# Patient Record
Sex: Female | Born: 1940
Health system: Southern US, Community
[De-identification: ages and names within clinical notes are randomized; demographics above are authoritative.]

## PROBLEM LIST (undated history)

## (undated) DIAGNOSIS — E119 Type 2 diabetes mellitus without complications: Secondary | ICD-10-CM

## (undated) DIAGNOSIS — M8580 Other specified disorders of bone density and structure, unspecified site: Secondary | ICD-10-CM

## (undated) DIAGNOSIS — R519 Headache, unspecified: Secondary | ICD-10-CM

## (undated) DIAGNOSIS — T4145XA Adverse effect of unspecified anesthetic, initial encounter: Secondary | ICD-10-CM

## (undated) DIAGNOSIS — F32A Depression, unspecified: Secondary | ICD-10-CM

## (undated) DIAGNOSIS — M797 Fibromyalgia: Secondary | ICD-10-CM

## (undated) DIAGNOSIS — K5730 Diverticulosis of large intestine without perforation or abscess without bleeding: Secondary | ICD-10-CM

## (undated) DIAGNOSIS — Z87442 Personal history of urinary calculi: Secondary | ICD-10-CM

## (undated) DIAGNOSIS — Z87898 Personal history of other specified conditions: Secondary | ICD-10-CM

## (undated) DIAGNOSIS — I10 Essential (primary) hypertension: Secondary | ICD-10-CM

## (undated) DIAGNOSIS — Z973 Presence of spectacles and contact lenses: Secondary | ICD-10-CM

## (undated) DIAGNOSIS — T8859XA Other complications of anesthesia, initial encounter: Secondary | ICD-10-CM

## (undated) DIAGNOSIS — H3530 Unspecified macular degeneration: Secondary | ICD-10-CM

## (undated) DIAGNOSIS — Z860101 Personal history of adenomatous and serrated colon polyps: Secondary | ICD-10-CM

## (undated) DIAGNOSIS — Z86010 Personal history of colonic polyps: Secondary | ICD-10-CM

## (undated) DIAGNOSIS — M1990 Unspecified osteoarthritis, unspecified site: Secondary | ICD-10-CM

## (undated) DIAGNOSIS — E785 Hyperlipidemia, unspecified: Secondary | ICD-10-CM

## (undated) DIAGNOSIS — Z972 Presence of dental prosthetic device (complete) (partial): Secondary | ICD-10-CM

## (undated) DIAGNOSIS — N201 Calculus of ureter: Secondary | ICD-10-CM

## (undated) HISTORY — PX: EXTRACORPOREAL SHOCK WAVE LITHOTRIPSY: SHX1557

## (undated) HISTORY — PX: TONSILLECTOMY: SUR1361

## (undated) HISTORY — PX: LAPAROSCOPIC CHOLECYSTECTOMY: SUR755

## (undated) HISTORY — PX: ANKLE SURGERY: SHX546

## (undated) HISTORY — PX: OTHER SURGICAL HISTORY: SHX169

---

## 1976-11-10 HISTORY — PX: NEPHROLITHOTOMY: SUR881

## 1976-11-10 HISTORY — PX: VAGINAL HYSTERECTOMY: SUR661

## 1990-11-10 HISTORY — PX: CATARACT EXTRACTION W/ INTRAOCULAR LENS  IMPLANT, BILATERAL: SHX1307

## 2004-07-29 ENCOUNTER — Other Ambulatory Visit: Admission: RE | Admit: 2004-07-29 | Discharge: 2004-07-29 | Payer: Self-pay | Admitting: Family Medicine

## 2004-09-20 ENCOUNTER — Ambulatory Visit (HOSPITAL_COMMUNITY): Admission: RE | Admit: 2004-09-20 | Discharge: 2004-09-20 | Payer: Self-pay | Admitting: Gastroenterology

## 2004-09-20 ENCOUNTER — Encounter (INDEPENDENT_AMBULATORY_CARE_PROVIDER_SITE_OTHER): Payer: Self-pay | Admitting: Specialist

## 2005-02-14 ENCOUNTER — Ambulatory Visit (HOSPITAL_COMMUNITY): Admission: RE | Admit: 2005-02-14 | Discharge: 2005-02-14 | Payer: Self-pay | Admitting: Family Medicine

## 2005-03-13 ENCOUNTER — Ambulatory Visit (HOSPITAL_COMMUNITY): Admission: RE | Admit: 2005-03-13 | Discharge: 2005-03-13 | Payer: Self-pay | Admitting: Urology

## 2005-04-04 ENCOUNTER — Ambulatory Visit: Payer: Self-pay

## 2005-04-09 ENCOUNTER — Encounter (INDEPENDENT_AMBULATORY_CARE_PROVIDER_SITE_OTHER): Payer: Self-pay | Admitting: Specialist

## 2005-04-09 ENCOUNTER — Observation Stay (HOSPITAL_COMMUNITY): Admission: RE | Admit: 2005-04-09 | Discharge: 2005-04-10 | Payer: Self-pay | Admitting: General Surgery

## 2007-07-22 ENCOUNTER — Other Ambulatory Visit: Admission: RE | Admit: 2007-07-22 | Discharge: 2007-07-22 | Payer: Self-pay | Admitting: Family Medicine

## 2008-01-17 ENCOUNTER — Encounter
Admission: RE | Admit: 2008-01-17 | Discharge: 2008-01-17 | Payer: Self-pay | Admitting: Physical Medicine and Rehabilitation

## 2008-09-02 ENCOUNTER — Emergency Department (HOSPITAL_COMMUNITY): Admission: EM | Admit: 2008-09-02 | Discharge: 2008-09-02 | Payer: Self-pay | Admitting: Emergency Medicine

## 2008-11-09 ENCOUNTER — Ambulatory Visit (HOSPITAL_BASED_OUTPATIENT_CLINIC_OR_DEPARTMENT_OTHER): Admission: RE | Admit: 2008-11-09 | Discharge: 2008-11-09 | Payer: Self-pay | Admitting: Orthopedic Surgery

## 2008-11-09 HISTORY — PX: OTHER SURGICAL HISTORY: SHX169

## 2009-12-12 ENCOUNTER — Encounter: Admission: RE | Admit: 2009-12-12 | Discharge: 2009-12-12 | Payer: Self-pay | Admitting: Family Medicine

## 2011-03-25 NOTE — Op Note (Signed)
NAME:  Ashley Dean, Ashley Dean NO.:  [id number]   MEDICAL RECORD NO.:  [id number]          PATIENT TYPE:  AMB   LOCATION:  DSC                          FACILITY:  [facility]   PHYSICIAN:  Loreta Ave, M.D. DATE OF BIRTH:  [birth year]   DATE OF PROCEDURE:  11/09/2008  DATE OF DISCHARGE:                               OPERATIVE REPORT   PREOPERATIVE DIAGNOSIS:  Nonunion lateral malleolar fracture, right  ankle distal fibula.   POSTOPERATIVE DIAGNOSIS:  Nonunion lateral malleolar fracture, right  ankle distal fibula.   PROCEDURE:  Takedown and then open reduction and internal fixation of  nonunion lateral malleolus, right ankle.  A locking 6-hole Synthes plate  and 1 interfragmentary screw.   SURGEON:  Loreta Ave, MD   ASSISTANT:  Genene Churn. Barry Dienes, [place], present throughout the entire case  necessary for timely completion of procedure.   ANESTHESIA:  General.   BLOOD LOSS:  Minimal.   TOURNIQUET TIME:  45 minutes.   SPECIMENS:  None.   CULTURES:  None.   COMPLICATIONS:  None.   PROCEDURE:  Soft compressive with short-leg splint.   PROCEDURE:  The patient was brought to the operating room and placed on  the operating table in the supine position.  After adequate anesthesia  had been obtained, tourniquet was applied on the upper aspect of the  right ankle.  Prepped and draped in the usual sterile fashion.  Exsanguinated with elevation of Esmarch.  Tourniquet was inflated to 200  mmHg.  Fluoroscopic guidance was used throughout.  Longitudinal incision  on the posterolateral aspect of the fibula.  Skin and subcutaneous  tissue were divided.  Subperiosteal exposure.  Obvious nonunion with  virtually no healing.  The distal fragment beginning at the level of the  mortise was displaced proximally and posteriorly.  Nonunion was freed  up.  Curetted back to good bleeding bone on either side.  Syndesmosis  intact.  I then reduced the distal fragment into anatomic  alignment and  then put a 6-hole plate posterolaterally on the fibula.  Clamps were  applied to hold the fracture reduced and plate placed.  The plate was  then fixed with locking screws from top to bottom.  At the bottom, care  was taken not to enter the ankle with the screws.  This gave good  alignment and fixation.  The fracture was oblique enough near the distal  end, I put an inner fragmentary screw across the fracture from front to  back with a 4.0-mm cancellous screw to further ensure compression and  fixation.  When that was complete, solid anatomic alignment and fixation  confirmed visually and fluoroscopically.  Good ankle motion and  stability.  Mortise was intact.  Wound was irrigated.  Closed with  Vicryl and staples.  Margin of wound was injected  with Marcaine.  Sterile compressive dressing was applied.  Short-leg  splint was applied.  Tourniquet was deflated and removed.  Anesthesia  was reversed.  Brought to the recovery room.  Tolerated the surgery  well.  No complications.      Margarette Asal.  Eulah Pont, M.D.  Electronically Signed     DFM/MEDQ  D:  11/09/2008  T:  11/10/2008  Job:  [id number]

## 2011-03-28 NOTE — Op Note (Signed)
NAME:  Ashley Dean, Ashley Dean NO.:  [id number]   MEDICAL RECORD NO.:  [id number]          PATIENT TYPE:  AMB   LOCATION:  ENDO                         FACILITY:  [facility]   PHYSICIAN:  Graylin Shiver, M.D.   DATE OF BIRTH:  07-07-1941   DATE OF PROCEDURE:  09/20/2004  DATE OF DISCHARGE:                                 OPERATIVE REPORT   PROCEDURE:  Colonoscopy with biopsy.   INDICATIONS FOR PROCEDURE:  Family history of colon cancer.   CONSENT:  Informed consent was obtained after explanation of the risks of  bleeding, infection, and perforation.   PREMEDICATION:  Fentanyl 75 mcg IV, Versed 7.5 mg IV.   PROCEDURE IN DETAIL:  With the patient in the left lateral decubitus  position, a rectal exam was performed and no masses were felt.  The Olympus  colonoscope was inserted into the rectum and advanced around the colon to  the cecum.  Cecal landmarks were identified.  The cecum looked normal.  In  the ascending colon, there was a 2 mm polyp which was biopsied with cold  forceps.  The transverse colon looked normal.  The descending colon and  sigmoid revealed diverticulosis.  The rectum was normal.  She tolerated the  procedure well without complications.   IMPRESSION:  1.  Small ascending colon polyp.  2.  Diverticulosis.   PLAN:  The pathology will be checked, I would recommend follow up  colonoscopy again in five years.       SFG/MEDQ  D:  09/20/2004  T:  09/20/2004  Job:  [id number]

## 2011-03-28 NOTE — Op Note (Signed)
NAME:  Ashley Dean, Ashley Dean NO.:  [id number]   MEDICAL RECORD NO.:  [id number]          PATIENT TYPE:  OBV   LOCATION:  0098                         FACILITY:  [facility]   PHYSICIAN:  Ollen Gross. Vernell Morgans, M.D. DATE OF BIRTH:  08-25-1941   DATE OF PROCEDURE:  04/09/2005  DATE OF DISCHARGE:                                 OPERATIVE REPORT   PREOPERATIVE DIAGNOSIS:  Gallstones.   POSTOPERATIVE DIAGNOSIS:  Gallstones.   PROCEDURE:  Laparoscopic cholecystectomy with intraoperative cholangiogram.   SURGEON:  Ollen Gross. Carolynne Edouard, M.D.   ASSISTANT:  Angelia Mould. Derrell Lolling, M.D.   ANESTHESIA:  General endotracheal.   PROCEDURE:  After informed consent was obtained, the patient was taken to  the operating room and placed in a supine position on the operating room  table.  After adequate induction of general anesthesia, the patient's  abdomen was prepped with Betadine and draped in the usual sterile manner.  The area below the umbilicus was infiltrated with 0.25% Marcaine.  A small  incision was made with a 15 blade knife.  This incision was carried down  through the subcutaneous tissue bluntly with a hemostat and [employer]  retractors until the linea alba was identified.  The linea alba was incised  with a 15 blade knife, and each side was grasped with Kocher clamps and  elevated anteriorly.  The pre-peritoneal space was then probed bluntly with  a hemostat until the peritoneum was opened, and access was gained to the  abdominal cavity.  A 0 Vicryl purse-string stitch was placed in the fascial  surrounding the opening.  A Hasson cannula was placed through the opening,  anchored in place with the previous first Vicryl purse-string stitch.  The  abdomen was then insufflated with carbon dioxide without difficulty.  The  patient was placed in a head-up position and rotated slightly, right side  up.  The laparoscope was inserted through the Hasson cannula and the right  upper quadrant was  inspected, and the dome of the gallbladder and liver  readily identified.  Next, the epigastric region was infiltrated with 0.25%  Marcaine.  A small incision was made with a 15 blade knife, and a 10 mm port  was placed bluntly through this incision into the abdominal cavity under  direct vision.  Sites were then chosen laterally on the right side of the  abdomen for placement of 5 mm ports.  Each of these areas were infiltrated  with 0.25% Marcaine.  Small stab incisions were made with a 15 blade knife,  and 5 mm ports were placed bluntly through these incisions into the  abdominal cavity under direct vision.  A blunt grasper was placed through  the lateral-most 5 mm port and used to grasp the dome of the gallbladder and  elevate it anteriorly and superiorly.  Some omental adhesions of the body of  the gallbladder were taken down, sharp dissection with the electrocautery  and blunt dissection with the dissector.  Another blunt grasper was placed  through the other 5 mm port and used to retract on the body and neck  of the  gallbladder.  A dissector was used to open the peritoneal reflection at the  gallbladder neck area.  Blunt dissection was then carried out in this area  until the gallbladder neck and cystic duct junction was readily identified,  and a good window was created.  An anterior branch of the artery was  identified, and this was also dissected bluntly in a circumferential manner  until a good window was created.  Two clips were placed proximally and one  distally in the artery, and the artery was divided within the two.  Next, a  clip was placed on the gallbladder neck.  A small ductotomy was made just  below the clip.  A 14 gauge angiocath was then placed percutaneously through  the anterior abdominal wall under direct vision.  A Reddick cholangiogram  catheter was placed through the angiocath and flushed.  The Reddick catheter  was then placed within the cystic duct and  anchored in place with a clip.  A  cholangiogram was obtained that showed no filling defects, good emptying in  the duodenum, and a good length on the cystic duct.  The anchoring clips and  catheters were then removed from the patient.  Three clips were placed  proximally on the cystic duct, and the duct was divided between the two sets  of clips.  Next, the laparoscopic hook cautery device was used to separate  the gallbladder from the liver bed.  A small artery was running up the right  side of the gallbladder, which required a couple of clips to control.  Prior  to completely detaching the gallbladder from the liver bed, the liver bed  was inspected, and several small bleeding points were coagulated with the  electrocautery until the area was completely hemostatic.  The gallbladder  was then detached the rest of the way from the liver bed electrocautery  without difficulty.  A laparoscopic bag was placed through the epigastric  port, and the gallbladder was placed through the bag and the bag was sealed.  The abdomen was then irrigated with copious amounts of saline until the  effluent was clear.  The liver bed was inspected again and found to be  hemostatic.  The laparoscope was then moved to the epigastric port, and a  gallbladder grasper was placed through the Hasson cannula and used to grasp  the open end of the bag.  The bag with the gallbladder was then removed  through the infraumbilical port with the Hasson cannula without difficulty.  The fascial defect was closed with the previously placed Vicryl purse-string  stitch as well as with another figure-of-eight 0 Vicryl stitch.  The rest of  the ports were removed under direct vision.  The gas was allowed to escape.  The skin incisions were all closed with interrupted 4-0 Monocryl  subcuticular stitches.  Benzoin and Steri-Strips were applied.  The patient tolerated the procedure well.  At the end of the case, all needle, sponge,   and instrument counts were correct.  Patient was then awakened and taken to  the recovery room in stable condition.       PST/MEDQ  D:  04/09/2005  T:  04/09/2005  Job:  [id number]

## 2011-08-11 LAB — GLUCOSE, CAPILLARY
Glucose-Capillary: 57 — ABNORMAL LOW
Glucose-Capillary: 94

## 2011-08-15 LAB — BASIC METABOLIC PANEL
BUN: 16 mg/dL (ref 6–23)
CO2: 30 mEq/L (ref 19–32)
Calcium: 9.1 mg/dL (ref 8.4–10.5)
Chloride: 102 mEq/L (ref 96–112)
Creatinine, Ser: 0.78 mg/dL (ref 0.4–1.2)
GFR calc Af Amer: >60 mL/min (ref >60)
GFR calc non Af Amer: >60 mL/min (ref >60)
Glucose, Bld: 112 mg/dL — ABNORMAL HIGH (ref 70–99)
Potassium: 3.8 mEq/L (ref 3.5–5.1)
Sodium: 140 mEq/L (ref 135–145)

## 2011-08-15 LAB — POCT HEMOGLOBIN-HEMACUE: Hemoglobin: 12.9 g/dL (ref 12.0–15.0)

## 2011-08-15 LAB — GLUCOSE, CAPILLARY
Glucose-Capillary: 118 mg/dL — ABNORMAL HIGH (ref 70–99)
Glucose-Capillary: 147 mg/dL — ABNORMAL HIGH (ref 70–99)

## 2011-11-18 DIAGNOSIS — M84376A Stress fracture, unspecified foot, initial encounter for fracture: Secondary | ICD-10-CM | POA: Diagnosis not present

## 2011-12-13 DIAGNOSIS — M62838 Other muscle spasm: Secondary | ICD-10-CM | POA: Diagnosis not present

## 2011-12-13 DIAGNOSIS — M549 Dorsalgia, unspecified: Secondary | ICD-10-CM | POA: Diagnosis not present

## 2011-12-16 DIAGNOSIS — M84376A Stress fracture, unspecified foot, initial encounter for fracture: Secondary | ICD-10-CM | POA: Diagnosis not present

## 2011-12-16 DIAGNOSIS — M5137 Other intervertebral disc degeneration, lumbosacral region: Secondary | ICD-10-CM | POA: Diagnosis not present

## 2011-12-25 DIAGNOSIS — E782 Mixed hyperlipidemia: Secondary | ICD-10-CM | POA: Diagnosis not present

## 2011-12-25 DIAGNOSIS — I10 Essential (primary) hypertension: Secondary | ICD-10-CM | POA: Diagnosis not present

## 2011-12-25 DIAGNOSIS — IMO0001 Reserved for inherently not codable concepts without codable children: Secondary | ICD-10-CM | POA: Diagnosis not present

## 2011-12-25 DIAGNOSIS — Z23 Encounter for immunization: Secondary | ICD-10-CM | POA: Diagnosis not present

## 2012-01-02 DIAGNOSIS — M47817 Spondylosis without myelopathy or radiculopathy, lumbosacral region: Secondary | ICD-10-CM | POA: Diagnosis not present

## 2012-01-02 DIAGNOSIS — M5126 Other intervertebral disc displacement, lumbar region: Secondary | ICD-10-CM | POA: Diagnosis not present

## 2012-01-02 DIAGNOSIS — M5137 Other intervertebral disc degeneration, lumbosacral region: Secondary | ICD-10-CM | POA: Diagnosis not present

## 2012-01-27 DIAGNOSIS — S92309A Fracture of unspecified metatarsal bone(s), unspecified foot, initial encounter for closed fracture: Secondary | ICD-10-CM | POA: Diagnosis not present

## 2012-03-09 DIAGNOSIS — R197 Diarrhea, unspecified: Secondary | ICD-10-CM | POA: Diagnosis not present

## 2012-03-09 DIAGNOSIS — E782 Mixed hyperlipidemia: Secondary | ICD-10-CM | POA: Diagnosis not present

## 2012-03-09 DIAGNOSIS — I10 Essential (primary) hypertension: Secondary | ICD-10-CM | POA: Diagnosis not present

## 2012-03-09 DIAGNOSIS — G4700 Insomnia, unspecified: Secondary | ICD-10-CM | POA: Diagnosis not present

## 2012-03-09 DIAGNOSIS — IMO0001 Reserved for inherently not codable concepts without codable children: Secondary | ICD-10-CM | POA: Diagnosis not present

## 2012-03-30 DIAGNOSIS — S92309A Fracture of unspecified metatarsal bone(s), unspecified foot, initial encounter for closed fracture: Secondary | ICD-10-CM | POA: Diagnosis not present

## 2012-04-13 DIAGNOSIS — Z1231 Encounter for screening mammogram for malignant neoplasm of breast: Secondary | ICD-10-CM | POA: Diagnosis not present

## 2012-04-21 DIAGNOSIS — L259 Unspecified contact dermatitis, unspecified cause: Secondary | ICD-10-CM | POA: Diagnosis not present

## 2012-04-21 DIAGNOSIS — IMO0001 Reserved for inherently not codable concepts without codable children: Secondary | ICD-10-CM | POA: Diagnosis not present

## 2012-05-04 DIAGNOSIS — S92309A Fracture of unspecified metatarsal bone(s), unspecified foot, initial encounter for closed fracture: Secondary | ICD-10-CM | POA: Diagnosis not present

## 2012-06-15 DIAGNOSIS — S92309A Fracture of unspecified metatarsal bone(s), unspecified foot, initial encounter for closed fracture: Secondary | ICD-10-CM | POA: Diagnosis not present

## 2012-06-17 DIAGNOSIS — H353190 Nonexudative age-related macular degeneration, unspecified eye, stage unspecified: Secondary | ICD-10-CM | POA: Diagnosis not present

## 2012-06-17 DIAGNOSIS — H524 Presbyopia: Secondary | ICD-10-CM | POA: Diagnosis not present

## 2012-06-25 DIAGNOSIS — M545 Low back pain, unspecified: Secondary | ICD-10-CM | POA: Diagnosis not present

## 2012-06-25 DIAGNOSIS — M5126 Other intervertebral disc displacement, lumbar region: Secondary | ICD-10-CM | POA: Diagnosis not present

## 2012-06-25 DIAGNOSIS — M5137 Other intervertebral disc degeneration, lumbosacral region: Secondary | ICD-10-CM | POA: Diagnosis not present

## 2012-08-03 DIAGNOSIS — S92309A Fracture of unspecified metatarsal bone(s), unspecified foot, initial encounter for closed fracture: Secondary | ICD-10-CM | POA: Diagnosis not present

## 2012-09-08 DIAGNOSIS — Z23 Encounter for immunization: Secondary | ICD-10-CM | POA: Diagnosis not present

## 2012-09-13 DIAGNOSIS — L259 Unspecified contact dermatitis, unspecified cause: Secondary | ICD-10-CM | POA: Diagnosis not present

## 2012-10-05 DIAGNOSIS — S92309A Fracture of unspecified metatarsal bone(s), unspecified foot, initial encounter for closed fracture: Secondary | ICD-10-CM | POA: Diagnosis not present

## 2012-10-05 DIAGNOSIS — H81399 Other peripheral vertigo, unspecified ear: Secondary | ICD-10-CM | POA: Diagnosis not present

## 2012-10-05 DIAGNOSIS — H66009 Acute suppurative otitis media without spontaneous rupture of ear drum, unspecified ear: Secondary | ICD-10-CM | POA: Diagnosis not present

## 2012-10-14 DIAGNOSIS — E782 Mixed hyperlipidemia: Secondary | ICD-10-CM | POA: Diagnosis not present

## 2012-10-14 DIAGNOSIS — N183 Chronic kidney disease, stage 3 unspecified: Secondary | ICD-10-CM | POA: Diagnosis not present

## 2012-10-14 DIAGNOSIS — B373 Candidiasis of vulva and vagina: Secondary | ICD-10-CM | POA: Diagnosis not present

## 2012-10-14 DIAGNOSIS — I10 Essential (primary) hypertension: Secondary | ICD-10-CM | POA: Diagnosis not present

## 2012-10-14 DIAGNOSIS — IMO0001 Reserved for inherently not codable concepts without codable children: Secondary | ICD-10-CM | POA: Diagnosis not present

## 2012-12-27 DIAGNOSIS — J0100 Acute maxillary sinusitis, unspecified: Secondary | ICD-10-CM | POA: Diagnosis not present

## 2013-01-05 DIAGNOSIS — S92309A Fracture of unspecified metatarsal bone(s), unspecified foot, initial encounter for closed fracture: Secondary | ICD-10-CM | POA: Diagnosis not present

## 2013-01-11 DIAGNOSIS — S92309A Fracture of unspecified metatarsal bone(s), unspecified foot, initial encounter for closed fracture: Secondary | ICD-10-CM | POA: Diagnosis not present

## 2013-01-17 DIAGNOSIS — X58XXXA Exposure to other specified factors, initial encounter: Secondary | ICD-10-CM | POA: Diagnosis not present

## 2013-01-17 DIAGNOSIS — S92309A Fracture of unspecified metatarsal bone(s), unspecified foot, initial encounter for closed fracture: Secondary | ICD-10-CM | POA: Diagnosis not present

## 2013-01-17 DIAGNOSIS — G8918 Other acute postprocedural pain: Secondary | ICD-10-CM | POA: Diagnosis not present

## 2013-01-25 DIAGNOSIS — IMO0001 Reserved for inherently not codable concepts without codable children: Secondary | ICD-10-CM | POA: Diagnosis not present

## 2013-03-01 DIAGNOSIS — IMO0001 Reserved for inherently not codable concepts without codable children: Secondary | ICD-10-CM | POA: Diagnosis not present

## 2013-03-04 DIAGNOSIS — M5126 Other intervertebral disc displacement, lumbar region: Secondary | ICD-10-CM | POA: Diagnosis not present

## 2013-03-04 DIAGNOSIS — M545 Low back pain, unspecified: Secondary | ICD-10-CM | POA: Diagnosis not present

## 2013-03-04 DIAGNOSIS — M5137 Other intervertebral disc degeneration, lumbosacral region: Secondary | ICD-10-CM | POA: Diagnosis not present

## 2013-03-23 DIAGNOSIS — D2370 Other benign neoplasm of skin of unspecified lower limb, including hip: Secondary | ICD-10-CM | POA: Diagnosis not present

## 2013-03-23 DIAGNOSIS — D2330 Other benign neoplasm of skin of unspecified part of face: Secondary | ICD-10-CM | POA: Diagnosis not present

## 2013-03-29 DIAGNOSIS — IMO0001 Reserved for inherently not codable concepts without codable children: Secondary | ICD-10-CM | POA: Diagnosis not present

## 2013-04-14 DIAGNOSIS — Z1231 Encounter for screening mammogram for malignant neoplasm of breast: Secondary | ICD-10-CM | POA: Diagnosis not present

## 2013-04-26 DIAGNOSIS — IMO0001 Reserved for inherently not codable concepts without codable children: Secondary | ICD-10-CM | POA: Diagnosis not present

## 2013-06-07 DIAGNOSIS — IMO0001 Reserved for inherently not codable concepts without codable children: Secondary | ICD-10-CM | POA: Diagnosis not present

## 2013-06-08 DIAGNOSIS — E119 Type 2 diabetes mellitus without complications: Secondary | ICD-10-CM | POA: Diagnosis not present

## 2013-06-08 DIAGNOSIS — IMO0001 Reserved for inherently not codable concepts without codable children: Secondary | ICD-10-CM | POA: Diagnosis not present

## 2013-06-08 DIAGNOSIS — G4700 Insomnia, unspecified: Secondary | ICD-10-CM | POA: Diagnosis not present

## 2013-06-08 DIAGNOSIS — N183 Chronic kidney disease, stage 3 unspecified: Secondary | ICD-10-CM | POA: Diagnosis not present

## 2013-06-08 DIAGNOSIS — E782 Mixed hyperlipidemia: Secondary | ICD-10-CM | POA: Diagnosis not present

## 2013-06-08 DIAGNOSIS — I10 Essential (primary) hypertension: Secondary | ICD-10-CM | POA: Diagnosis not present

## 2013-07-01 DIAGNOSIS — M5126 Other intervertebral disc displacement, lumbar region: Secondary | ICD-10-CM | POA: Diagnosis not present

## 2013-07-01 DIAGNOSIS — M5137 Other intervertebral disc degeneration, lumbosacral region: Secondary | ICD-10-CM | POA: Diagnosis not present

## 2013-07-01 DIAGNOSIS — M79609 Pain in unspecified limb: Secondary | ICD-10-CM | POA: Diagnosis not present

## 2013-08-18 DIAGNOSIS — Z23 Encounter for immunization: Secondary | ICD-10-CM | POA: Diagnosis not present

## 2013-08-25 DIAGNOSIS — H353190 Nonexudative age-related macular degeneration, unspecified eye, stage unspecified: Secondary | ICD-10-CM | POA: Diagnosis not present

## 2013-08-25 DIAGNOSIS — E119 Type 2 diabetes mellitus without complications: Secondary | ICD-10-CM | POA: Diagnosis not present

## 2013-08-25 DIAGNOSIS — H524 Presbyopia: Secondary | ICD-10-CM | POA: Diagnosis not present

## 2013-09-13 DIAGNOSIS — R42 Dizziness and giddiness: Secondary | ICD-10-CM | POA: Diagnosis not present

## 2013-09-13 DIAGNOSIS — H6120 Impacted cerumen, unspecified ear: Secondary | ICD-10-CM | POA: Diagnosis not present

## 2013-09-13 DIAGNOSIS — H8110 Benign paroxysmal vertigo, unspecified ear: Secondary | ICD-10-CM | POA: Diagnosis not present

## 2013-12-06 DIAGNOSIS — M5137 Other intervertebral disc degeneration, lumbosacral region: Secondary | ICD-10-CM | POA: Diagnosis not present

## 2013-12-13 DIAGNOSIS — E782 Mixed hyperlipidemia: Secondary | ICD-10-CM | POA: Diagnosis not present

## 2013-12-13 DIAGNOSIS — Z1331 Encounter for screening for depression: Secondary | ICD-10-CM | POA: Diagnosis not present

## 2013-12-13 DIAGNOSIS — I10 Essential (primary) hypertension: Secondary | ICD-10-CM | POA: Diagnosis not present

## 2013-12-13 DIAGNOSIS — IMO0001 Reserved for inherently not codable concepts without codable children: Secondary | ICD-10-CM | POA: Diagnosis not present

## 2013-12-13 DIAGNOSIS — E119 Type 2 diabetes mellitus without complications: Secondary | ICD-10-CM | POA: Diagnosis not present

## 2013-12-13 DIAGNOSIS — N183 Chronic kidney disease, stage 3 unspecified: Secondary | ICD-10-CM | POA: Diagnosis not present

## 2014-01-12 DIAGNOSIS — R7989 Other specified abnormal findings of blood chemistry: Secondary | ICD-10-CM | POA: Diagnosis not present

## 2014-01-21 DIAGNOSIS — J0100 Acute maxillary sinusitis, unspecified: Secondary | ICD-10-CM | POA: Diagnosis not present

## 2014-01-21 DIAGNOSIS — J209 Acute bronchitis, unspecified: Secondary | ICD-10-CM | POA: Diagnosis not present

## 2014-01-31 DIAGNOSIS — M545 Low back pain, unspecified: Secondary | ICD-10-CM | POA: Diagnosis not present

## 2014-02-20 DIAGNOSIS — L821 Other seborrheic keratosis: Secondary | ICD-10-CM | POA: Diagnosis not present

## 2014-02-20 DIAGNOSIS — L820 Inflamed seborrheic keratosis: Secondary | ICD-10-CM | POA: Diagnosis not present

## 2014-02-20 DIAGNOSIS — L255 Unspecified contact dermatitis due to plants, except food: Secondary | ICD-10-CM | POA: Diagnosis not present

## 2014-04-23 DIAGNOSIS — T148 Other injury of unspecified body region: Secondary | ICD-10-CM | POA: Diagnosis not present

## 2014-04-23 DIAGNOSIS — W57XXXA Bitten or stung by nonvenomous insect and other nonvenomous arthropods, initial encounter: Secondary | ICD-10-CM | POA: Diagnosis not present

## 2014-06-01 DIAGNOSIS — Z1231 Encounter for screening mammogram for malignant neoplasm of breast: Secondary | ICD-10-CM | POA: Diagnosis not present

## 2014-07-06 DIAGNOSIS — IMO0001 Reserved for inherently not codable concepts without codable children: Secondary | ICD-10-CM | POA: Diagnosis not present

## 2014-07-06 DIAGNOSIS — M899 Disorder of bone, unspecified: Secondary | ICD-10-CM | POA: Diagnosis not present

## 2014-07-06 DIAGNOSIS — M949 Disorder of cartilage, unspecified: Secondary | ICD-10-CM | POA: Diagnosis not present

## 2014-07-06 DIAGNOSIS — E119 Type 2 diabetes mellitus without complications: Secondary | ICD-10-CM | POA: Diagnosis not present

## 2014-07-06 DIAGNOSIS — I10 Essential (primary) hypertension: Secondary | ICD-10-CM | POA: Diagnosis not present

## 2014-07-06 DIAGNOSIS — N183 Chronic kidney disease, stage 3 unspecified: Secondary | ICD-10-CM | POA: Diagnosis not present

## 2014-07-06 DIAGNOSIS — E782 Mixed hyperlipidemia: Secondary | ICD-10-CM | POA: Diagnosis not present

## 2014-08-09 DIAGNOSIS — M545 Low back pain, unspecified: Secondary | ICD-10-CM | POA: Diagnosis not present

## 2014-08-17 DIAGNOSIS — Z23 Encounter for immunization: Secondary | ICD-10-CM | POA: Diagnosis not present

## 2014-09-05 DIAGNOSIS — H3531 Nonexudative age-related macular degeneration: Secondary | ICD-10-CM | POA: Diagnosis not present

## 2014-09-05 DIAGNOSIS — E119 Type 2 diabetes mellitus without complications: Secondary | ICD-10-CM | POA: Diagnosis not present

## 2015-01-24 DIAGNOSIS — M5136 Other intervertebral disc degeneration, lumbar region: Secondary | ICD-10-CM | POA: Diagnosis not present

## 2015-02-13 DIAGNOSIS — M5136 Other intervertebral disc degeneration, lumbar region: Secondary | ICD-10-CM | POA: Diagnosis not present

## 2015-03-07 DIAGNOSIS — M5136 Other intervertebral disc degeneration, lumbar region: Secondary | ICD-10-CM | POA: Diagnosis not present

## 2015-03-12 DIAGNOSIS — M797 Fibromyalgia: Secondary | ICD-10-CM | POA: Diagnosis not present

## 2015-03-12 DIAGNOSIS — E782 Mixed hyperlipidemia: Secondary | ICD-10-CM | POA: Diagnosis not present

## 2015-03-12 DIAGNOSIS — E1121 Type 2 diabetes mellitus with diabetic nephropathy: Secondary | ICD-10-CM | POA: Diagnosis not present

## 2015-03-12 DIAGNOSIS — M62830 Muscle spasm of back: Secondary | ICD-10-CM | POA: Diagnosis not present

## 2015-03-12 DIAGNOSIS — Z23 Encounter for immunization: Secondary | ICD-10-CM | POA: Diagnosis not present

## 2015-03-12 DIAGNOSIS — N183 Chronic kidney disease, stage 3 (moderate): Secondary | ICD-10-CM | POA: Diagnosis not present

## 2015-03-12 DIAGNOSIS — I10 Essential (primary) hypertension: Secondary | ICD-10-CM | POA: Diagnosis not present

## 2015-03-12 DIAGNOSIS — G4700 Insomnia, unspecified: Secondary | ICD-10-CM | POA: Diagnosis not present

## 2015-04-17 DIAGNOSIS — A930 Oropouche virus disease: Secondary | ICD-10-CM | POA: Diagnosis not present

## 2015-04-17 DIAGNOSIS — L03115 Cellulitis of right lower limb: Secondary | ICD-10-CM | POA: Diagnosis not present

## 2015-04-17 DIAGNOSIS — R944 Abnormal results of kidney function studies: Secondary | ICD-10-CM | POA: Diagnosis not present

## 2015-04-17 DIAGNOSIS — A778 Other spotted fevers: Secondary | ICD-10-CM | POA: Diagnosis not present

## 2015-06-13 DIAGNOSIS — Z1231 Encounter for screening mammogram for malignant neoplasm of breast: Secondary | ICD-10-CM | POA: Diagnosis not present

## 2015-06-27 DIAGNOSIS — R509 Fever, unspecified: Secondary | ICD-10-CM | POA: Diagnosis not present

## 2015-06-27 DIAGNOSIS — J189 Pneumonia, unspecified organism: Secondary | ICD-10-CM | POA: Diagnosis not present

## 2015-06-27 DIAGNOSIS — J0100 Acute maxillary sinusitis, unspecified: Secondary | ICD-10-CM | POA: Diagnosis not present

## 2015-07-19 DIAGNOSIS — M2550 Pain in unspecified joint: Secondary | ICD-10-CM | POA: Diagnosis not present

## 2015-07-19 DIAGNOSIS — M797 Fibromyalgia: Secondary | ICD-10-CM | POA: Diagnosis not present

## 2015-07-19 DIAGNOSIS — M791 Myalgia: Secondary | ICD-10-CM | POA: Diagnosis not present

## 2015-08-17 DIAGNOSIS — Z23 Encounter for immunization: Secondary | ICD-10-CM | POA: Diagnosis not present

## 2015-08-21 DIAGNOSIS — Z1389 Encounter for screening for other disorder: Secondary | ICD-10-CM | POA: Diagnosis not present

## 2015-08-21 DIAGNOSIS — M797 Fibromyalgia: Secondary | ICD-10-CM | POA: Diagnosis not present

## 2015-09-17 DIAGNOSIS — F419 Anxiety disorder, unspecified: Secondary | ICD-10-CM | POA: Diagnosis not present

## 2015-09-17 DIAGNOSIS — E119 Type 2 diabetes mellitus without complications: Secondary | ICD-10-CM | POA: Diagnosis not present

## 2015-09-17 DIAGNOSIS — M797 Fibromyalgia: Secondary | ICD-10-CM | POA: Diagnosis not present

## 2015-09-17 DIAGNOSIS — I10 Essential (primary) hypertension: Secondary | ICD-10-CM | POA: Diagnosis not present

## 2015-09-17 DIAGNOSIS — N183 Chronic kidney disease, stage 3 (moderate): Secondary | ICD-10-CM | POA: Diagnosis not present

## 2015-09-17 DIAGNOSIS — R8290 Unspecified abnormal findings in urine: Secondary | ICD-10-CM | POA: Diagnosis not present

## 2015-09-17 DIAGNOSIS — H353131 Nonexudative age-related macular degeneration, bilateral, early dry stage: Secondary | ICD-10-CM | POA: Diagnosis not present

## 2015-09-17 DIAGNOSIS — E782 Mixed hyperlipidemia: Secondary | ICD-10-CM | POA: Diagnosis not present

## 2015-09-17 DIAGNOSIS — G4700 Insomnia, unspecified: Secondary | ICD-10-CM | POA: Diagnosis not present

## 2015-09-17 DIAGNOSIS — E1121 Type 2 diabetes mellitus with diabetic nephropathy: Secondary | ICD-10-CM | POA: Diagnosis not present

## 2015-09-24 DIAGNOSIS — R319 Hematuria, unspecified: Secondary | ICD-10-CM | POA: Diagnosis not present

## 2015-09-24 DIAGNOSIS — N750 Cyst of Bartholin's gland: Secondary | ICD-10-CM | POA: Diagnosis not present

## 2015-09-27 DIAGNOSIS — N898 Other specified noninflammatory disorders of vagina: Secondary | ICD-10-CM | POA: Diagnosis not present

## 2015-09-27 DIAGNOSIS — R140 Abdominal distension (gaseous): Secondary | ICD-10-CM | POA: Diagnosis not present

## 2015-09-27 DIAGNOSIS — N750 Cyst of Bartholin's gland: Secondary | ICD-10-CM | POA: Diagnosis not present

## 2015-10-11 DIAGNOSIS — N839 Noninflammatory disorder of ovary, fallopian tube and broad ligament, unspecified: Secondary | ICD-10-CM | POA: Diagnosis not present

## 2015-10-11 DIAGNOSIS — R300 Dysuria: Secondary | ICD-10-CM | POA: Diagnosis not present

## 2015-10-11 DIAGNOSIS — N750 Cyst of Bartholin's gland: Secondary | ICD-10-CM | POA: Diagnosis not present

## 2015-10-11 DIAGNOSIS — R140 Abdominal distension (gaseous): Secondary | ICD-10-CM | POA: Diagnosis not present

## 2015-10-15 ENCOUNTER — Other Ambulatory Visit: Payer: Self-pay | Admitting: Nurse Practitioner

## 2015-10-15 DIAGNOSIS — N838 Other noninflammatory disorders of ovary, fallopian tube and broad ligament: Secondary | ICD-10-CM

## 2015-10-17 ENCOUNTER — Ambulatory Visit
Admission: RE | Admit: 2015-10-17 | Discharge: 2015-10-17 | Disposition: A | Discharge status: Home / Self Care | Payer: Medicare Other | Source: Ambulatory Visit | Attending: Nurse Practitioner | Admitting: Nurse Practitioner

## 2015-10-17 DIAGNOSIS — N83292 Other ovarian cyst, left side: Secondary | ICD-10-CM | POA: Diagnosis not present

## 2015-10-17 DIAGNOSIS — N838 Other noninflammatory disorders of ovary, fallopian tube and broad ligament: Secondary | ICD-10-CM

## 2015-10-17 MED ORDER — GADOBENATE DIMEGLUMINE 529 MG/ML IV SOLN
16.0000 mL | Freq: Once | INTRAVENOUS | Status: AC | PRN
  Administered 2015-10-17: 16 mL via INTRAVENOUS

## 2015-10-24 DIAGNOSIS — J0100 Acute maxillary sinusitis, unspecified: Secondary | ICD-10-CM | POA: Diagnosis not present

## 2015-10-24 DIAGNOSIS — R1012 Left upper quadrant pain: Secondary | ICD-10-CM | POA: Diagnosis not present

## 2015-10-24 DIAGNOSIS — N200 Calculus of kidney: Secondary | ICD-10-CM | POA: Diagnosis not present

## 2015-10-25 ENCOUNTER — Other Ambulatory Visit: Payer: Self-pay | Admitting: Urology

## 2015-11-06 ENCOUNTER — Encounter (HOSPITAL_COMMUNITY): Payer: Self-pay | Admitting: *Deleted

## 2015-11-07 NOTE — H&P (Signed)
Active Problems Problems  1. Abdominal pain, LUQ (left upper quadrant) (R10.12) 2. Kidney stone on left side (N20.0)  History of Present Illness Many is a 74 yo WF who I last saw for stones in 2011.  She had left flank pain for about a week intermittently in mid November. The pain was moderately severe with occasional nausea. She had recurrence of the pain on Monday but has had none since. She has had no gross hematuria but she had microhematuria when she saw Dr. Tamala Julian in November.  She has some frequency and nocturia but no urgency. She had a left lithotomy in 1978 and has had ESWL x 2.   Past Medical History Problems  1. History of Abdominal pain, RUQ (right upper quadrant) (R10.11) 2. History of Arthritis 3. History of depression (Z86.59) 4. History of diabetes mellitus (Z86.39) 5. History of hypercholesterolemia (Z86.39) 6. History of hypertension (Z86.79) 7. History of kidney stones (Z87.442) 8. History of sleep apnea (Z87.09)  Surgical History Problems  1. History of Cholecystectomy 2. History of Foot Surgery 3. History of Hysterectomy 4. History of Lithotomy 5. History of Renal Lithotripsy  Current Meds 1. Ambien 10 MG Oral Tablet;  Therapy: (Recorded:02Mar2011) to Recorded 2. Ambien TABS;  Therapy: (Recorded:14Dec2016) to Recorded 3. Atenolol 50 MG Oral Tablet;  Therapy: (Recorded:02Mar2011) to Recorded 4. Calcium + D TABS;  Therapy: (Recorded:02Mar2011) to Recorded 5. Cefdinir CAPS;  Therapy: (Recorded:14Dec2016) to Recorded 6. Crestor 10 MG Oral Tablet;  Therapy: (Recorded:02Mar2011) to Recorded 7. Cymbalta 20 MG Oral Capsule Delayed Release Particles;  Therapy: (Recorded:14Dec2016) to Recorded 8. GlipiZIDE-MetFORMIN HCl - 5-500 MG Oral Tablet;  Therapy: (Recorded:02Mar2011) to Recorded 9. Lisinopril-Hydrochlorothiazide 20-25 MG Oral Tablet;  Therapy: (Recorded:02Mar2011) to Recorded 10. Ocuvite TABS;   Therapy: (Recorded:14Dec2016) to Recorded 11.  Rosuvastatin Calcium TABS;   Therapy: (Recorded:14Dec2016) to Recorded  Allergies Medication  1. Macrodantin CAPS  Family History Problems  1. Family history of Colon Cancer : Father 2. Family history of Death In The Family Father : Father   deceased age 67--cancer 40. Family history of Death In The Family Mother : Father   deceased age 1 4. Family history of Family Health Status Number Of Children : Father   3 sons  Social History Problems    Denied: History of Alcohol Use (History)   Caffeine Use   1/2 glass per day   Former smoker [phone])   Marital History - Currently Married   Number of children   3 sons   Retired   Tobacco Use   smoked for 20+yrs--quit 56yrs ago  Review of Systems Genitourinary, constitutional, skin, eye, otolaryngeal, hematologic/lymphatic, cardiovascular, pulmonary, endocrine, musculoskeletal, gastrointestinal, neurological and psychiatric system(s) were reviewed and pertinent findings if present are noted and are otherwise negative.  Genitourinary: urinary frequency and nocturia.  Gastrointestinal: nausea.  Constitutional: night sweats.  Musculoskeletal: back pain.    Vitals Vital Signs [Data Includes: Last 1 Day]  Recorded: [id number] 02:34PM  Height: 5 ft 5 in Weight: 175 lb  BMI Calculated: 29.12 BSA Calculated: 1.87 Blood Pressure: 162 / 81 Temperature: 98.3 F Heart Rate: 65  Physical Exam ENT:. The ears and nose are normal in appearance.  Neck: The appearance of the neck is normal and no neck mass is present.  Pulmonary: No respiratory distress and normal respiratory rhythm and effort.  Cardiovascular: Heart rate and rhythm are normal . No peripheral edema.  Abdomen: The abdomen is soft and nontender. No masses are palpated. Tenderness in the LUQ is present. mild  left CVA tenderness no CVA tenderness. No hernias are palpable. No hepatosplenomegaly noted.  Lymphatics: The posterior cervical and supraclavicular nodes are  not enlarged or tender.  Skin: Normal skin turgor, no visible rash and no visible skin lesions.  Neuro/Psych:. Mood and affect are appropriate.    Results/Data Urine [Data Includes: Last 1 Day]   [id number]  COLOR YELLOW   APPEARANCE CLEAR   SPECIFIC GRAVITY 1.020   pH 5.0   GLUCOSE NEGATIVE   BILIRUBIN NEGATIVE   KETONE NEGATIVE   BLOOD TRACE   PROTEIN NEGATIVE   NITRITE NEGATIVE   LEUKOCYTE ESTERASE NEGATIVE   SQUAMOUS EPITHELIAL/HPF 0-5 HPF  WBC 0-5 WBC/HPF  RBC 0-2 RBC/HPF  BACTERIA NONE SEEN HPF  CRYSTALS See Below HPF  CASTS NONE SEEN LPF  Yeast NONE SEEN HPF   Old records or history reviewed: Records from Dr. Tamala Julian reviewed.  The following images/tracing/specimen were independently visualized:  I reviewed her MRI films and on the coronal views there was a left renal filling defect suggestive of a renal pelvic stone. KUB today shows a 9 x 52mm ovoid calcification consistent with a left renal pelvic stone. She has marked lumbar degenerative disease with scoliosis. There are clips in the RUQ. No other abnormalities are noted.  The following clinical lab reports were reviewed:  UA reviewed.    Assessment Assessed  1. Abdominal pain, LUQ (left upper quadrant) (R10.12) 2. Kidney stone on left side (N20.0)  She has intermittent left flank pain with a 9x81mm left renal pelvic stone.  Her ovarian cyst shows some worrisome features.   Plan Health Maintenance  1. UA With REFLEX; [Do Not Release]; Status:Resulted - Requires Verification;   [id number] 01:47PM Kidney stone on left side  2. Follow-up Schedule Surgery Office  Follow-up  Status: Hold For - Appointment   Requested for: 14Dec2016 3. KUB; Status:Resulted - Requires Verification;   [id number] 02:59PM  I discussed the options for therapy and will get her set up for left ESWL which has been successful for her previously. I reviewed the risks of bleeding, infection, potentially serious injury to the kidney or  adjacent organs, need for secondary procedures for obstructing fragments, thrombotic events and anesthetic risks.   She has pain med on hand.   Discussion/Summary CC: Dr. Carol Ada and Dr. Cephus Slater with [facility].

## 2015-11-08 ENCOUNTER — Ambulatory Visit (HOSPITAL_COMMUNITY): Payer: Medicare Other

## 2015-11-08 ENCOUNTER — Encounter (HOSPITAL_COMMUNITY)
Admission: RE | Disposition: A | Discharge status: Home / Self Care | Payer: Self-pay | Source: Ambulatory Visit | Attending: Urology

## 2015-11-08 ENCOUNTER — Encounter (HOSPITAL_COMMUNITY): Payer: Self-pay | Admitting: *Deleted

## 2015-11-08 ENCOUNTER — Ambulatory Visit (HOSPITAL_COMMUNITY)
Admission: RE | Admit: 2015-11-08 | Discharge: 2015-11-08 | Disposition: A | Discharge status: Home / Self Care | Payer: Medicare Other | Source: Ambulatory Visit | Attending: Urology | Admitting: Urology

## 2015-11-08 DIAGNOSIS — Z7984 Long term (current) use of oral hypoglycemic drugs: Secondary | ICD-10-CM | POA: Diagnosis not present

## 2015-11-08 DIAGNOSIS — E119 Type 2 diabetes mellitus without complications: Secondary | ICD-10-CM | POA: Diagnosis not present

## 2015-11-08 DIAGNOSIS — I10 Essential (primary) hypertension: Secondary | ICD-10-CM | POA: Insufficient documentation

## 2015-11-08 DIAGNOSIS — Z79899 Other long term (current) drug therapy: Secondary | ICD-10-CM | POA: Diagnosis not present

## 2015-11-08 DIAGNOSIS — M1990 Unspecified osteoarthritis, unspecified site: Secondary | ICD-10-CM | POA: Diagnosis not present

## 2015-11-08 DIAGNOSIS — Z87891 Personal history of nicotine dependence: Secondary | ICD-10-CM | POA: Diagnosis not present

## 2015-11-08 DIAGNOSIS — Z87442 Personal history of urinary calculi: Secondary | ICD-10-CM | POA: Diagnosis not present

## 2015-11-08 DIAGNOSIS — E7800 Pure hypercholesterolemia, unspecified: Secondary | ICD-10-CM | POA: Insufficient documentation

## 2015-11-08 DIAGNOSIS — G4730 Sleep apnea, unspecified: Secondary | ICD-10-CM | POA: Insufficient documentation

## 2015-11-08 DIAGNOSIS — F329 Major depressive disorder, single episode, unspecified: Secondary | ICD-10-CM | POA: Insufficient documentation

## 2015-11-08 DIAGNOSIS — N200 Calculus of kidney: Secondary | ICD-10-CM | POA: Diagnosis not present

## 2015-11-08 DIAGNOSIS — Z01818 Encounter for other preprocedural examination: Secondary | ICD-10-CM | POA: Diagnosis not present

## 2015-11-08 DIAGNOSIS — R109 Unspecified abdominal pain: Secondary | ICD-10-CM | POA: Diagnosis present

## 2015-11-08 DIAGNOSIS — N83209 Unspecified ovarian cyst, unspecified side: Secondary | ICD-10-CM | POA: Insufficient documentation

## 2015-11-08 HISTORY — DX: Essential (primary) hypertension: I10

## 2015-11-08 HISTORY — DX: Fibromyalgia: M79.7

## 2015-11-08 HISTORY — DX: Unspecified osteoarthritis, unspecified site: M19.90

## 2015-11-08 LAB — GLUCOSE, CAPILLARY: Glucose-Capillary: 149 mg/dL — ABNORMAL HIGH (ref 65–99)

## 2015-11-08 SURGERY — LITHOTRIPSY, ESWL
Anesthesia: LOCAL | Laterality: Left

## 2015-11-08 MED ORDER — ACETAMINOPHEN 325 MG PO TABS: 650.0000 mg | ORAL_TABLET | ORAL | Status: DC | PRN

## 2015-11-08 MED ORDER — ACETAMINOPHEN 650 MG RE SUPP
650.0000 mg | RECTAL | Status: DC | PRN
  Filled 2015-11-08: qty 1

## 2015-11-08 MED ORDER — SODIUM CHLORIDE 0.9 % IV SOLN
INTRAVENOUS | Status: DC
  Administered 2015-11-08: 11:00:00 via INTRAVENOUS

## 2015-11-08 MED ORDER — OXYCODONE HCL 5 MG PO TABS: 5.0000 mg | ORAL_TABLET | ORAL | Status: DC | PRN

## 2015-11-08 MED ORDER — SODIUM CHLORIDE 0.9 % IJ SOLN: 3.0000 mL | Freq: Two times a day (BID) | INTRAMUSCULAR | Status: DC

## 2015-11-08 MED ORDER — FENTANYL CITRATE (PF) 100 MCG/2ML IJ SOLN: 25.0000 ug | INTRAMUSCULAR | Status: DC | PRN

## 2015-11-08 MED ORDER — DIPHENHYDRAMINE HCL 25 MG PO CAPS
25.0000 mg | ORAL_CAPSULE | ORAL | Status: AC
  Administered 2015-11-08: 25 mg via ORAL
  Filled 2015-11-08: qty 1

## 2015-11-08 MED ORDER — CIPROFLOXACIN HCL 500 MG PO TABS
500.0000 mg | ORAL_TABLET | ORAL | Status: AC
  Administered 2015-11-08: 500 mg via ORAL
  Filled 2015-11-08: qty 1

## 2015-11-08 MED ORDER — HYDROCODONE-ACETAMINOPHEN 5-325 MG PO TABS: 1.0000 | ORAL_TABLET | Freq: Four times a day (QID) | ORAL | Status: DC | PRN

## 2015-11-08 MED ORDER — SODIUM CHLORIDE 0.9 % IJ SOLN: 3.0000 mL | INTRAMUSCULAR | Status: DC | PRN

## 2015-11-08 MED ORDER — DIAZEPAM 5 MG PO TABS
10.0000 mg | ORAL_TABLET | ORAL | Status: AC
  Administered 2015-11-08: 10 mg via ORAL
  Filled 2015-11-08: qty 2

## 2015-11-08 MED ORDER — SODIUM CHLORIDE 0.9 % IV SOLN: 250.0000 mL | INTRAVENOUS | Status: DC | PRN

## 2015-11-08 NOTE — Discharge Instructions (Addendum)
Lithotripsy, Care After °Refer to this sheet in the next few weeks. These instructions provide you with information on caring for yourself after your procedure. Your health care provider may also give you more specific instructions. Your treatment has been planned according to current medical practices, but problems sometimes occur. Call your health care provider if you have any problems or questions after your procedure. °WHAT TO EXPECT AFTER THE PROCEDURE  °· Your urine may have a red tinge for a few days after treatment. Blood loss is usually minimal. °· You may have soreness in the back or flank area. This usually goes away after a few days. The procedure can cause blotches or bruises on the back where the pressure wave enters the skin. These marks usually cause only minimal discomfort and should disappear in a short time. °· Stone fragments should begin to pass within 24 hours of treatment. However, a delayed passage is not unusual. °· You may have pain, discomfort, and feel sick to your stomach (nauseated) when the crushed fragments of stone are passed down the tube from the kidney to the bladder. Stone fragments can pass soon after the procedure and may last for up to 4-8 weeks. °· A small number of patients may have severe pain when stone fragments are not able to pass, which leads to an obstruction. °· If your stone is greater than 1 inch (2.5 cm) in diameter or if you have multiple stones that have a combined diameter greater than 1 inch (2.5 cm), you may require more than one treatment. °· If you had a stent placed prior to your procedure, you may experience some discomfort, especially during urination. You may experience the pain or discomfort in your flank or back, or you may experience a sharp pain or discomfort at the base of your penis or in your lower abdomen. The discomfort usually lasts only a few minutes after urinating. °HOME CARE INSTRUCTIONS  °· Rest at home until you feel your energy  improving. °· Only take over-the-counter or prescription medicines for pain, discomfort, or fever as directed by your health care provider. Depending on the type of lithotripsy, you may need to take antibiotics and anti-inflammatory medicines for a few days. °· Drink enough water and fluids to keep your urine clear or pale yellow. This helps "flush" your kidneys. It helps pass any remaining pieces of stone and prevents stones from coming back. °· Most people can resume daily activities within 1-2 days after standard lithotripsy. It can take longer to recover from laser and percutaneous lithotripsy. °· Strain all urine through the provided strainer. Keep all particulate matter and stones for your health care provider to see. The stone may be as small as a grain of salt. It is very important to use the strainer each and every time you pass your urine. Any stones that are found can be sent to a medical lab for examination. °· Visit your health care provider for a follow-up appointment in a few weeks. Your doctor may remove your stent if you have one. Your health care provider will also check to see whether stone particles still remain. °SEEK MEDICAL CARE IF:  °· Your pain is not relieved by medicine. °· You have a lasting nauseous feeling. °· You feel there is too much blood in the urine. °· You develop persistent problems with frequent or painful urination that does not at least partially improve after 2 days following the procedure. °· You have a congested cough. °· You feel   lightheaded. °· You develop a rash or any other signs that might suggest an allergic problem. °· You develop any reaction or side effects to your medicine(s). °SEEK IMMEDIATE MEDICAL CARE IF:  °· You experience severe back or flank pain or both. °· You see nothing but blood when you urinate. °· You cannot pass any urine at all. °· You have a fever or shaking chills. °· You develop shortness of breath, difficulty breathing, or chest pain. °· You  develop vomiting that will not stop after 6-8 hours. °· You have a fainting episode. °  °This information is not intended to replace advice given to you by your health care provider. Make sure you discuss any questions you have with your health care provider. °  °Document Released: 11/16/2007 Document Revised: 07/18/2015 Document Reviewed: 05/12/2013 °Elsevier Interactive Patient Education ©2016 [employer]    ° ° ° °                                                                                                           Moderate Conscious Sedation, Adult, Care After °Refer to this sheet in the next few weeks. These instructions provide you with information on caring for yourself after your procedure. Your health care provider may also give you more specific instructions. Your treatment has been planned according to current medical practices, but problems sometimes occur. Call your health care provider if you have any problems or questions after your procedure. °WHAT TO EXPECT AFTER THE PROCEDURE  °After your procedure: °· You may feel sleepy, clumsy, and have poor balance for several hours. °· Vomiting may occur if you eat too soon after the procedure. °HOME CARE INSTRUCTIONS °· Do not participate in any activities where you could become injured for at least 24 hours. Do not: °¨ Drive. °¨ Swim. °¨ Ride a bicycle. °¨ Operate heavy machinery. °¨ Cook. °¨ Use power tools. °¨ Climb ladders. °¨ Work from a high place. °· Do not make important decisions or sign legal documents until you are improved. °· If you vomit, drink water, juice, or soup when you can drink without vomiting. Make sure you have little or no nausea before eating solid foods. °· Only take over-the-counter or prescription medicines for pain, discomfort, or fever as directed by your health care provider. °· Make sure you and your family fully understand everything about the medicines given to you, including what side effects may occur. °· You should  not drink alcohol, take sleeping pills, or take medicines that cause drowsiness for at least 24 hours. °· If you smoke, do not smoke without supervision. °· If you are feeling better, you may resume normal activities 24 hours after you were sedated. °· Keep all appointments with your health care provider. °SEEK MEDICAL CARE IF: °· Your skin is pale or bluish in color. °· You continue to feel nauseous or vomit. °· Your pain is getting worse and is not helped by medicine. °· You have bleeding or swelling. °· You are still sleepy or feeling clumsy after 24 hours. °SEEK

## 2015-11-08 NOTE — Interval H&P Note (Signed)
History and Physical Interval Note:  11/08/2015 12:43 PM  Ashley Dean  has presented today for surgery, with the diagnosis of LEFT RENAL STONE  The various methods of treatment have been discussed with the patient and family. After consideration of risks, benefits and other options for treatment, the patient has consented to  Procedure(s): LEFT EXTRACORPOREAL SHOCK WAVE LITHOTRIPSY (ESWL) (Left) as a surgical intervention .  The patient's history has been reviewed, patient examined, no change in status, stable for surgery.  I have reviewed the patient's chart and labs.  Questions were answered to the patient's satisfaction.     Jerae Izard J

## 2015-11-15 DIAGNOSIS — N909 Noninflammatory disorder of vulva and perineum, unspecified: Secondary | ICD-10-CM | POA: Diagnosis not present

## 2015-11-15 DIAGNOSIS — R971 Elevated cancer antigen 125 [CA 125]: Secondary | ICD-10-CM | POA: Diagnosis not present

## 2015-11-15 DIAGNOSIS — N839 Noninflammatory disorder of ovary, fallopian tube and broad ligament, unspecified: Secondary | ICD-10-CM | POA: Diagnosis not present

## 2015-11-15 DIAGNOSIS — Z800 Family history of malignant neoplasm of digestive organs: Secondary | ICD-10-CM | POA: Diagnosis not present

## 2015-11-15 DIAGNOSIS — Z87891 Personal history of nicotine dependence: Secondary | ICD-10-CM | POA: Diagnosis not present

## 2015-11-15 DIAGNOSIS — N83299 Other ovarian cyst, unspecified side: Secondary | ICD-10-CM | POA: Diagnosis not present

## 2015-11-15 DIAGNOSIS — R1900 Intra-abdominal and pelvic swelling, mass and lump, unspecified site: Secondary | ICD-10-CM | POA: Diagnosis not present

## 2015-11-27 DIAGNOSIS — Z79899 Other long term (current) drug therapy: Secondary | ICD-10-CM | POA: Diagnosis not present

## 2015-11-27 DIAGNOSIS — R1900 Intra-abdominal and pelvic swelling, mass and lump, unspecified site: Secondary | ICD-10-CM | POA: Diagnosis not present

## 2015-11-27 DIAGNOSIS — E119 Type 2 diabetes mellitus without complications: Secondary | ICD-10-CM | POA: Diagnosis not present

## 2015-11-27 DIAGNOSIS — Z7984 Long term (current) use of oral hypoglycemic drugs: Secondary | ICD-10-CM | POA: Diagnosis not present

## 2015-11-27 DIAGNOSIS — Z01810 Encounter for preprocedural cardiovascular examination: Secondary | ICD-10-CM | POA: Diagnosis not present

## 2015-11-27 DIAGNOSIS — Z01818 Encounter for other preprocedural examination: Secondary | ICD-10-CM | POA: Diagnosis not present

## 2015-11-27 DIAGNOSIS — Z01812 Encounter for preprocedural laboratory examination: Secondary | ICD-10-CM | POA: Diagnosis not present

## 2015-11-29 DIAGNOSIS — Z0000 Encounter for general adult medical examination without abnormal findings: Secondary | ICD-10-CM | POA: Diagnosis not present

## 2015-11-29 DIAGNOSIS — Z87442 Personal history of urinary calculi: Secondary | ICD-10-CM | POA: Diagnosis not present

## 2015-11-29 DIAGNOSIS — N200 Calculus of kidney: Secondary | ICD-10-CM | POA: Diagnosis not present

## 2015-12-04 DIAGNOSIS — Z7984 Long term (current) use of oral hypoglycemic drugs: Secondary | ICD-10-CM | POA: Diagnosis not present

## 2015-12-04 DIAGNOSIS — E119 Type 2 diabetes mellitus without complications: Secondary | ICD-10-CM | POA: Diagnosis not present

## 2015-12-04 DIAGNOSIS — M8580 Other specified disorders of bone density and structure, unspecified site: Secondary | ICD-10-CM | POA: Diagnosis not present

## 2015-12-04 DIAGNOSIS — Z87891 Personal history of nicotine dependence: Secondary | ICD-10-CM | POA: Diagnosis not present

## 2015-12-04 DIAGNOSIS — Z79899 Other long term (current) drug therapy: Secondary | ICD-10-CM | POA: Diagnosis not present

## 2015-12-04 DIAGNOSIS — E785 Hyperlipidemia, unspecified: Secondary | ICD-10-CM | POA: Diagnosis not present

## 2015-12-04 DIAGNOSIS — D271 Benign neoplasm of left ovary: Secondary | ICD-10-CM | POA: Diagnosis not present

## 2015-12-04 DIAGNOSIS — I10 Essential (primary) hypertension: Secondary | ICD-10-CM | POA: Diagnosis not present

## 2015-12-04 DIAGNOSIS — R1904 Left lower quadrant abdominal swelling, mass and lump: Secondary | ICD-10-CM | POA: Diagnosis not present

## 2015-12-13 DIAGNOSIS — Z90722 Acquired absence of ovaries, bilateral: Secondary | ICD-10-CM | POA: Diagnosis not present

## 2015-12-13 DIAGNOSIS — D271 Benign neoplasm of left ovary: Secondary | ICD-10-CM | POA: Diagnosis not present

## 2016-03-05 DIAGNOSIS — N200 Calculus of kidney: Secondary | ICD-10-CM | POA: Diagnosis not present

## 2016-03-05 DIAGNOSIS — Z0000 Encounter for general adult medical examination without abnormal findings: Secondary | ICD-10-CM | POA: Diagnosis not present

## 2016-03-11 DIAGNOSIS — M797 Fibromyalgia: Secondary | ICD-10-CM | POA: Diagnosis not present

## 2016-03-11 DIAGNOSIS — I10 Essential (primary) hypertension: Secondary | ICD-10-CM | POA: Diagnosis not present

## 2016-03-11 DIAGNOSIS — E782 Mixed hyperlipidemia: Secondary | ICD-10-CM | POA: Diagnosis not present

## 2016-03-11 DIAGNOSIS — Z7984 Long term (current) use of oral hypoglycemic drugs: Secondary | ICD-10-CM | POA: Diagnosis not present

## 2016-03-11 DIAGNOSIS — E1121 Type 2 diabetes mellitus with diabetic nephropathy: Secondary | ICD-10-CM | POA: Diagnosis not present

## 2016-03-11 DIAGNOSIS — N183 Chronic kidney disease, stage 3 (moderate): Secondary | ICD-10-CM | POA: Diagnosis not present

## 2016-03-11 DIAGNOSIS — G4700 Insomnia, unspecified: Secondary | ICD-10-CM | POA: Diagnosis not present

## 2016-04-16 DIAGNOSIS — E782 Mixed hyperlipidemia: Secondary | ICD-10-CM | POA: Diagnosis not present

## 2016-04-16 DIAGNOSIS — R51 Headache: Secondary | ICD-10-CM | POA: Diagnosis not present

## 2016-04-24 DIAGNOSIS — D224 Melanocytic nevi of scalp and neck: Secondary | ICD-10-CM | POA: Diagnosis not present

## 2016-04-24 DIAGNOSIS — L821 Other seborrheic keratosis: Secondary | ICD-10-CM | POA: Diagnosis not present

## 2016-04-24 DIAGNOSIS — D1801 Hemangioma of skin and subcutaneous tissue: Secondary | ICD-10-CM | POA: Diagnosis not present

## 2016-08-12 DIAGNOSIS — Z1231 Encounter for screening mammogram for malignant neoplasm of breast: Secondary | ICD-10-CM | POA: Diagnosis not present

## 2016-08-21 DIAGNOSIS — K591 Functional diarrhea: Secondary | ICD-10-CM | POA: Diagnosis not present

## 2016-08-22 DIAGNOSIS — R197 Diarrhea, unspecified: Secondary | ICD-10-CM | POA: Diagnosis not present

## 2016-09-01 DIAGNOSIS — Z23 Encounter for immunization: Secondary | ICD-10-CM | POA: Diagnosis not present

## 2016-09-01 DIAGNOSIS — E1121 Type 2 diabetes mellitus with diabetic nephropathy: Secondary | ICD-10-CM | POA: Diagnosis not present

## 2016-09-22 DIAGNOSIS — M461 Sacroiliitis, not elsewhere classified: Secondary | ICD-10-CM | POA: Diagnosis not present

## 2016-09-22 DIAGNOSIS — M5136 Other intervertebral disc degeneration, lumbar region: Secondary | ICD-10-CM | POA: Diagnosis not present

## 2016-09-22 DIAGNOSIS — M1288 Other specific arthropathies, not elsewhere classified, other specified site: Secondary | ICD-10-CM | POA: Diagnosis not present

## 2016-10-07 DIAGNOSIS — M5136 Other intervertebral disc degeneration, lumbar region: Secondary | ICD-10-CM | POA: Diagnosis not present

## 2016-12-02 DIAGNOSIS — N183 Chronic kidney disease, stage 3 (moderate): Secondary | ICD-10-CM | POA: Diagnosis not present

## 2016-12-02 DIAGNOSIS — E1165 Type 2 diabetes mellitus with hyperglycemia: Secondary | ICD-10-CM | POA: Diagnosis not present

## 2016-12-02 DIAGNOSIS — E782 Mixed hyperlipidemia: Secondary | ICD-10-CM | POA: Diagnosis not present

## 2016-12-02 DIAGNOSIS — I10 Essential (primary) hypertension: Secondary | ICD-10-CM | POA: Diagnosis not present

## 2016-12-02 DIAGNOSIS — G4700 Insomnia, unspecified: Secondary | ICD-10-CM | POA: Diagnosis not present

## 2016-12-02 DIAGNOSIS — S0511XA Contusion of eyeball and orbital tissues, right eye, initial encounter: Secondary | ICD-10-CM | POA: Diagnosis not present

## 2016-12-02 DIAGNOSIS — M797 Fibromyalgia: Secondary | ICD-10-CM | POA: Diagnosis not present

## 2016-12-02 DIAGNOSIS — R42 Dizziness and giddiness: Secondary | ICD-10-CM | POA: Diagnosis not present

## 2016-12-02 DIAGNOSIS — S40022A Contusion of left upper arm, initial encounter: Secondary | ICD-10-CM | POA: Diagnosis not present

## 2017-01-05 DIAGNOSIS — H353131 Nonexudative age-related macular degeneration, bilateral, early dry stage: Secondary | ICD-10-CM | POA: Diagnosis not present

## 2017-01-05 DIAGNOSIS — H524 Presbyopia: Secondary | ICD-10-CM | POA: Diagnosis not present

## 2017-01-05 DIAGNOSIS — E119 Type 2 diabetes mellitus without complications: Secondary | ICD-10-CM | POA: Diagnosis not present

## 2017-02-23 DIAGNOSIS — J209 Acute bronchitis, unspecified: Secondary | ICD-10-CM | POA: Diagnosis not present

## 2017-03-16 DIAGNOSIS — R3121 Asymptomatic microscopic hematuria: Secondary | ICD-10-CM | POA: Diagnosis not present

## 2017-03-16 DIAGNOSIS — N201 Calculus of ureter: Secondary | ICD-10-CM | POA: Diagnosis not present

## 2017-04-13 DIAGNOSIS — L255 Unspecified contact dermatitis due to plants, except food: Secondary | ICD-10-CM | POA: Diagnosis not present

## 2017-04-23 IMAGING — MR MR PELVIS WO/W CM
5 of 10 series · 21 of 48 positions shown · IV contrast (16ml Multihance)
Comparison: CT scan from [DATE].

CLINICAL DATA: Subsequent encounter for 2 month history of left
ovarian cyst.

EXAM:
MRI PELVIS WITHOUT AND WITH CONTRAST
TECHNIQUE: Multiplanar multisequence MR imaging of the pelvis was performed
both before and after administration of intravenous contrast.
CONTRAST:  16mL MULTIHANCE GADOBENATE DIMEGLUMINE 529 MG/ML IV SOLN

[Series 4: T2 · coronal · 5.0mm · 0.78mm/px · 4 of 27 slices shown]
[im 1/27]
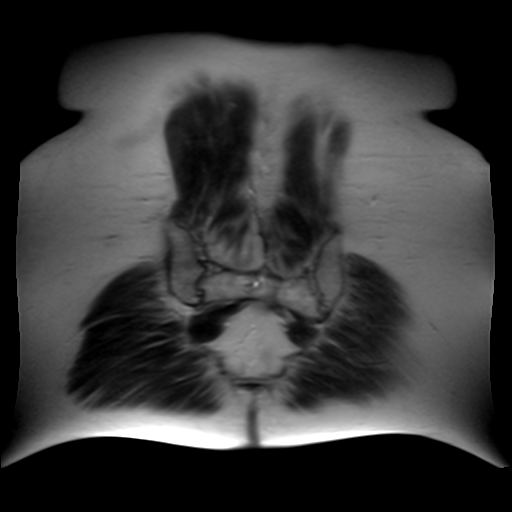
[im 9/27]
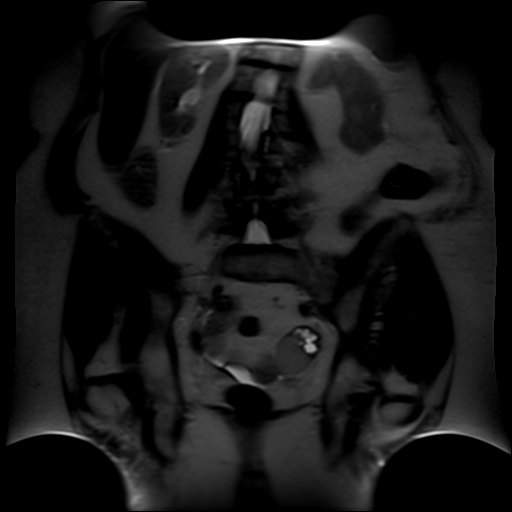
[im 18/27]
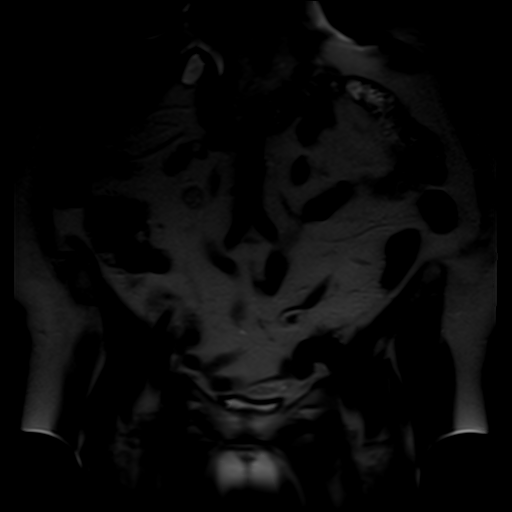
[im 27/27]
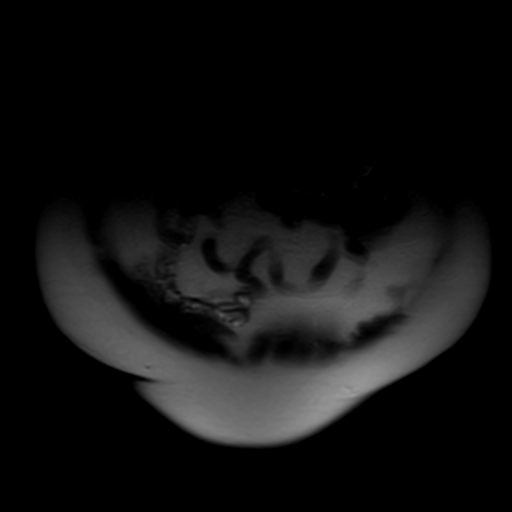

[Series 5: t2_tse_sag · sagittal · 5.0mm · 0.65mm/px · 4 of 25 slices shown]
[im 1/25]
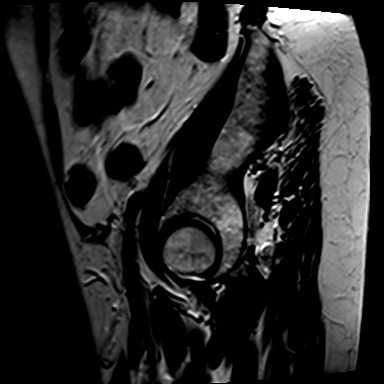
[im 9/25]
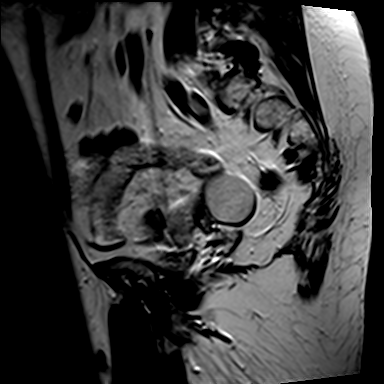
[im 17/25]
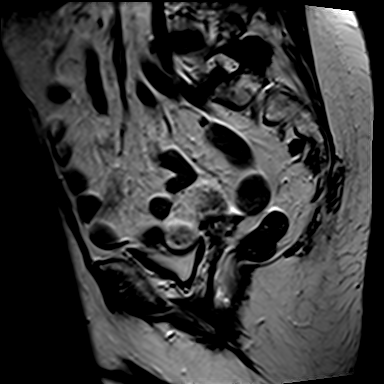
[im 25/25]
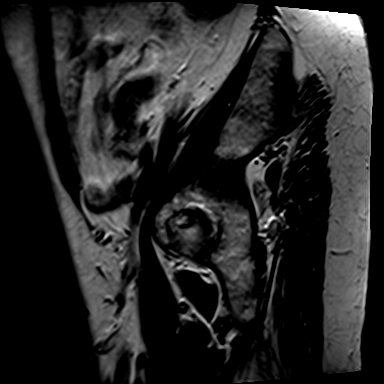

[Series 6: t2_tse axial · axial · 5.0mm · 0.51mm/px · z∈[-51,+111]mm · 5 of 27 slices shown]
[im 1/27]
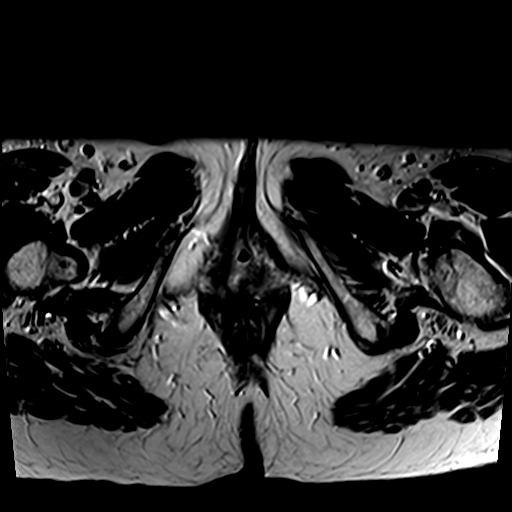
[im 7/27]
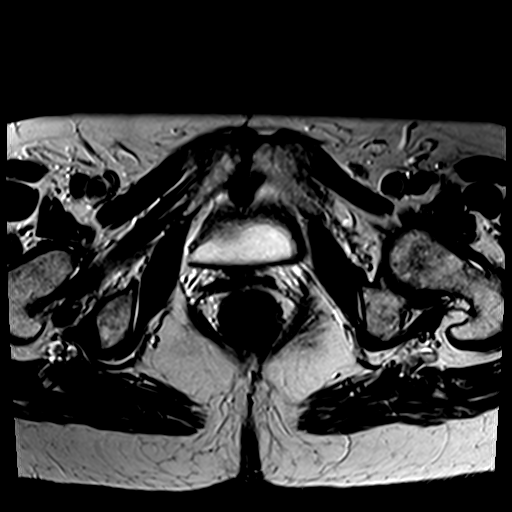
[im 14/27]
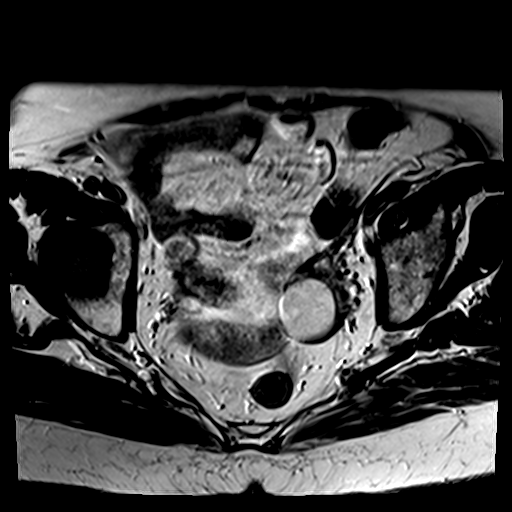
[im 20/27]
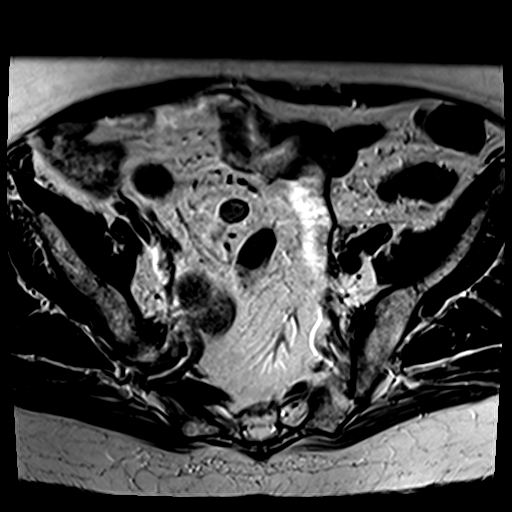
[im 27/27]
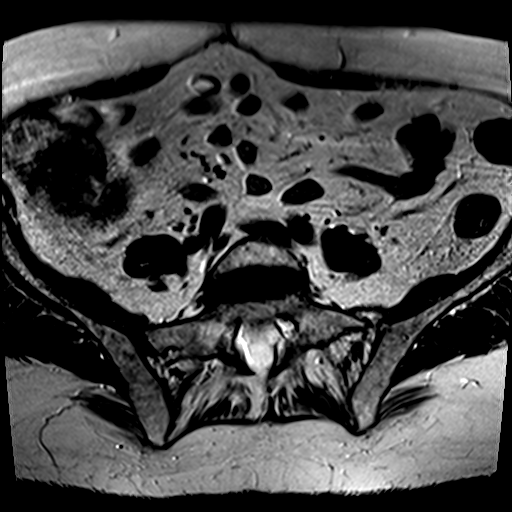

[Series 7: t2_tse axial fs · axial · 5.0mm · 0.51mm/px · z∈[-51,+111]mm · 5 of 27 slices shown]
[im 1/27]
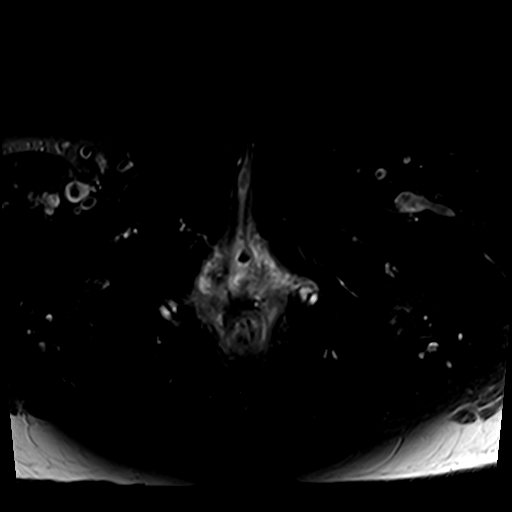
[im 7/27]
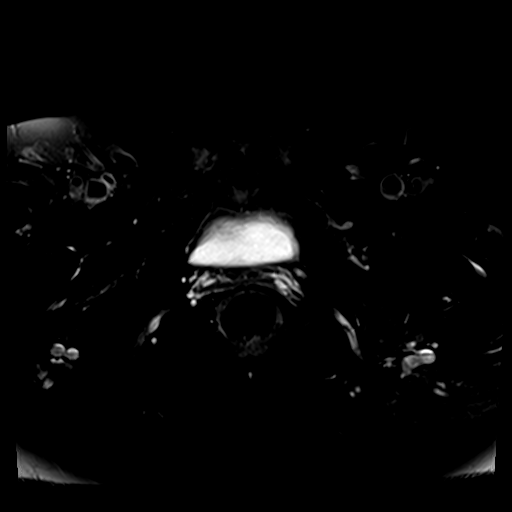
[im 14/27]
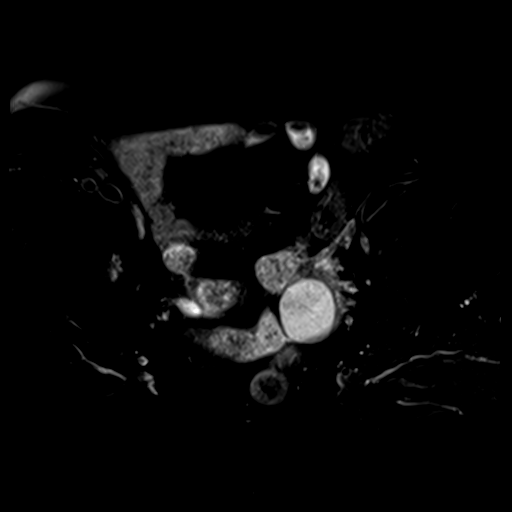
[im 20/27]
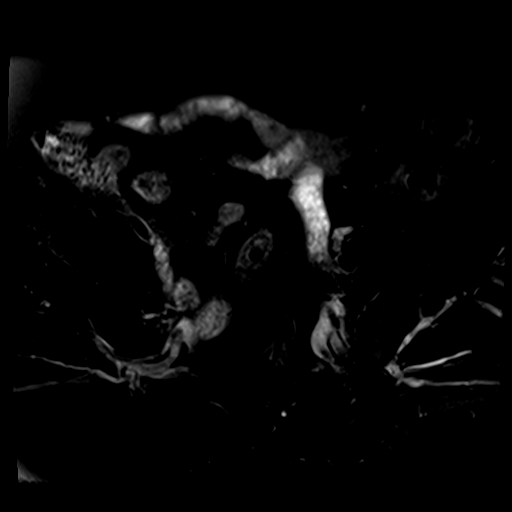
[im 27/27]
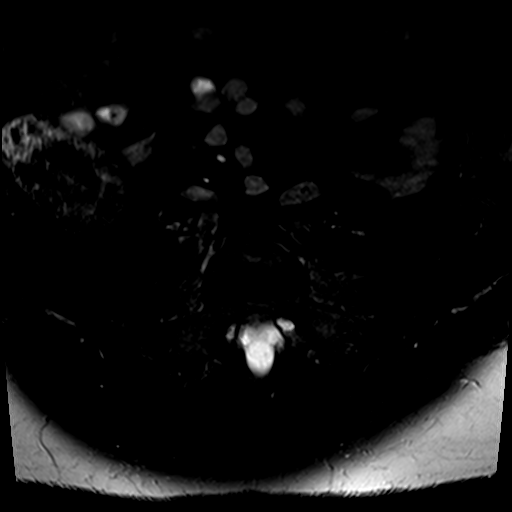

[Series 8: axial spgr · axial · 5.0mm · 0.51mm/px · z∈[-51,+30]mm · 3 of 27 slices shown]
[im 1/27]
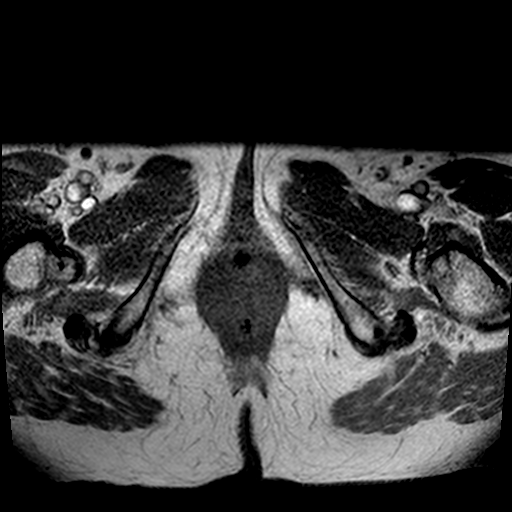
[im 7/27]
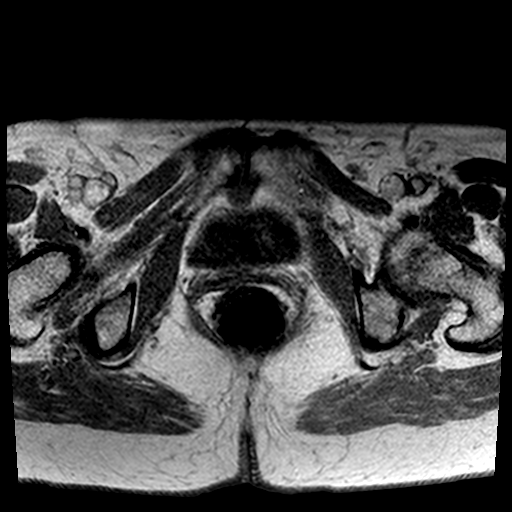
[im 14/27]
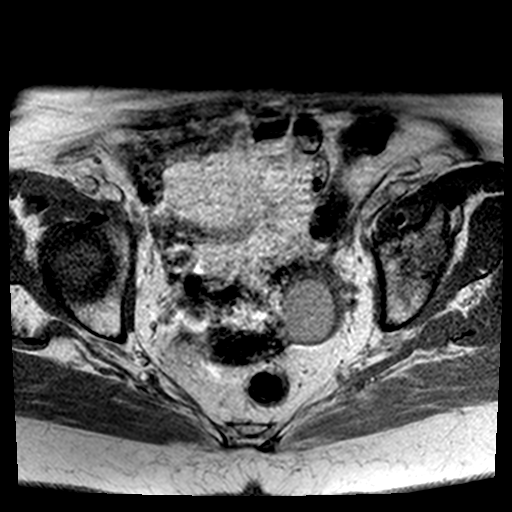

[21 of 48 positions shown; findings below may reference images not displayed]

FINDINGS: Uterus is surgically absent.

Complex 4.3 x 4.6 x 3.4 cm multi cystic lesion is identified in the
left ovary. Signal intensity of the different cystic components
varies, suggesting differential proteinaceous or hemorrhagic content
within the cystic areas. There is some irregular nodularity along
the cranial margin of the cyst and septal enhancement is evident.

Right ovary measures approximately 3.2 x 1.3 x 1.9 cm.

No intraperitoneal free fluid. No evidence for pelvic sidewall
lymphadenopathy. Urinary bladder is unremarkable. The visualized
portion of the urethra is normal.

No abnormal marrow enhancement within the visualized bony anatomy
IMPRESSION: 4.6 cm complex cystic mass arising from the left ovary. Imaging
features are nonspecific, but neoplasm would be a distinct
consideration.

## 2017-05-04 DIAGNOSIS — Z87442 Personal history of urinary calculi: Secondary | ICD-10-CM | POA: Diagnosis not present

## 2017-05-04 DIAGNOSIS — R3121 Asymptomatic microscopic hematuria: Secondary | ICD-10-CM | POA: Diagnosis not present

## 2017-05-07 DIAGNOSIS — R3121 Asymptomatic microscopic hematuria: Secondary | ICD-10-CM | POA: Diagnosis not present

## 2017-05-07 DIAGNOSIS — N200 Calculus of kidney: Secondary | ICD-10-CM | POA: Diagnosis not present

## 2017-05-07 DIAGNOSIS — R319 Hematuria, unspecified: Secondary | ICD-10-CM | POA: Diagnosis not present

## 2017-05-11 ENCOUNTER — Other Ambulatory Visit: Payer: Self-pay | Admitting: Urology

## 2017-05-11 ENCOUNTER — Encounter (HOSPITAL_BASED_OUTPATIENT_CLINIC_OR_DEPARTMENT_OTHER): Payer: Self-pay | Admitting: *Deleted

## 2017-05-11 NOTE — Progress Notes (Signed)
NPO AFTER MN.  ARRIVE AT 0730.  NEEDS ISTAT 8 AND EKG.  WILL TAKE ATENOLOL AND CRESTOR AM DOS W/ SIPS OF WATER.

## 2017-05-18 NOTE — H&P (Signed)
CC: I have kidney stones.  HPI: Ashley Dean is a 76 year-old female established patient who is here for renal calculi.  The problem is on the left side.   Ashley Dean returns today in f/u. She was found to have a possible LUVJ stone on KUB on 03/16/17. She has had no pain or hematuria and I don't see a stone on the KUB today but she still has microhematuria. She a left renal stone treated with ESWL on 11/08/15. T Her stone was Calcium Oxalate. She reports and odor to the urine but no dysuria, urgency or frequency.       ALLERGIES: Macrodantin CAPS    MEDICATIONS: Metformin Hcl 500 mg tablet  Atenolol 50 MG Oral Tablet Oral  Crestor 10 MG Oral Tablet Oral  Cymbalta 20 mg capsule,delayed release Oral  Lisinopril-Hydrochlorothiazide 20-25 MG Oral Tablet Oral  Melatonin 5 mg tablet  Ocuvite With Lutein 1,000 unit-200 mg-60 unit-40 mg-55 mcg-2 mg-2 mg tablet Oral  Tramadol Hcl 50 mg tablet Oral     GU PSH: ESWL - 11/20/2015, 2011 Hysterectomy Unilat SO - 10/24/2015 Remove Kidney Stone - 2011      PSH Notes: Renal Lithotripsy, Hysterectomy, Lithotomy, Foot Surgery, Cholecystectomy, Lithotripsy   NON-GU PSH: Cholecystectomy (open) - 2011    GU PMH: Microscopic hematuria - 03/16/2017 Ureteral calculus, She has a possible LUVJ stone but I am not sure that is what is causing her pain. She does have some hematuria. I am going to give her rapaflo since she has a sulfa allergy. She will return in about a months with a KUB. - 03/16/2017 LUQ pain, Abdominal pain, LUQ (left upper quadrant) - 03/05/2016 Renal calculus, Kidney stone on left side - 03/05/2016 RUQ pain, Abdominal pain, RUQ (right upper quadrant) - 10/24/2015 History of urolithiasis, Nephrolithiasis - 2014      PMH Notes:  2010-01-09 12:30:05 - Note: Arthritis   NON-GU PMH: Encounter for general adult medical examination without abnormal findings, Encounter for preventive health examination - 11/29/2015 Personal history of other  diseases of the nervous system and sense organs, History of sleep apnea - 10/24/2015 Personal history of other diseases of the circulatory system, History of hypertension - 2014 Personal history of other endocrine, nutritional and metabolic disease, History of diabetes mellitus - 2014, History of hypercholesterolemia, - 2014 Personal history of other mental and behavioral disorders, History of depression - 2014    FAMILY HISTORY: Colon Cancer - Father Death In The Family Father - Father Death In The Family Mother - Father Family Health Status Number - Father   SOCIAL HISTORY: Marital Status: Married     Notes: Retired, 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 Patient denies frequent urination, hard to postpone urination, burning /pain with urination, leakage of urine, stream starts and stops, trouble starting your stream, have to strain to urinate, and being pregnant.  Gastrointestinal (Upper):   Patient denies nausea, vomiting, and indigestion/ heartburn.  Gastrointestinal (Lower):   Patient denies diarrhea and constipation.  Constitutional:   Patient reports night sweats. Patient denies fever, weight loss, and fatigue.  Skin:   Patient denies skin rash/ lesion and itching.  Eyes:   Patient denies blurred vision and double vision.  Ears/ Nose/ Throat:   Patient denies sore throat and sinus problems.  Hematologic/Lymphatic:   Patient denies swollen glands  and easy bruising.  Cardiovascular:   Patient denies leg swelling and chest pains.  Respiratory:   Patient denies cough and shortness of breath.  Endocrine:   Patient denies excessive thirst.  Musculoskeletal:   Patient reports back pain. Patient denies joint pain.  Neurological:   Patient denies headaches and dizziness.  Psychologic:   Patient denies depression and anxiety.    VITAL SIGNS:      05/04/2017 01:51 PM  Weight 165 lb / 74.84 kg  Height 65 in / 165.1 cm  BP 108/70 mmHg  Pulse 61 /min  Temperature 98.2 F / 37 C  BMI 27.5 kg/m   PAST DATA REVIEWED:  Source Of History:  Patient  Urine Test Review:   Urinalysis   PROCEDURES:         KUB - 11572  A single view of the abdomen is obtained. I no longer see the LUVJ stone. She has stable spinal curvature with degenerative disease. No gas or soft tissue abnormalities noted.                Urinalysis w/Scope - 81001 Dipstick Dipstick Cont'd Micro  Color: Yellow Bilirubin: Neg WBC/hpf: 0 - 5/hpf  Appearance: Clear Ketones: Neg RBC/hpf: 20 - 40/hpf  Specific Gravity: 1.020 Blood: 3+ Bacteria: NS (Not Seen)  pH: 6.0 Protein: Trace Cystals: NS (Not Seen)  Glucose: Neg Urobilinogen: 0.2 Casts: NS (Not Seen)    Nitrites: Neg Trichomonas: Not Present    Leukocyte Esterase: Neg Mucous: Not Present      Epithelial Cells: 0 - 5/hpf      Yeast: NS (Not Seen)      Sperm: Not Present    ASSESSMENT:      ICD-10 Details  1 GU:   Microscopic hematuria - R31.21 She has persistent hematuria but I don't clearly see the stone anymore. I am going to get her set up for a CT hematuria study with return for possible cystoscopy.  2   History of urolithiasis - Z87.442      PLAN:           Orders Labs BUN/Creatinine          Schedule X-Rays: C.T. Hematuria With and Without I.V. Contrast - Next available  Return Visit/Planned Activity: ASAP - Office Visit, Cystoscopy             Note: with CT results for possible cystoscopy.          Document Letter(s):  Created for Patient: Clinical Summary         Notes:   CC: Dr. Carol Ada.       APPENDED NOTES:  CT shows the left distal ureteral stone has not passed. I called Ashley Dean and will get her set up for left ureteroscopy. Risks reviewed.

## 2017-05-19 ENCOUNTER — Ambulatory Visit (HOSPITAL_BASED_OUTPATIENT_CLINIC_OR_DEPARTMENT_OTHER): Payer: Medicare Other | Admitting: Anesthesiology

## 2017-05-19 ENCOUNTER — Other Ambulatory Visit: Payer: Self-pay

## 2017-05-19 ENCOUNTER — Ambulatory Visit (HOSPITAL_BASED_OUTPATIENT_CLINIC_OR_DEPARTMENT_OTHER)
Admission: RE | Admit: 2017-05-19 | Discharge: 2017-05-19 | Disposition: A | Discharge status: Home / Self Care | Payer: Medicare Other | Source: Ambulatory Visit | Attending: Urology | Admitting: Urology

## 2017-05-19 ENCOUNTER — Encounter (HOSPITAL_BASED_OUTPATIENT_CLINIC_OR_DEPARTMENT_OTHER): Payer: Self-pay | Admitting: *Deleted

## 2017-05-19 ENCOUNTER — Encounter (HOSPITAL_BASED_OUTPATIENT_CLINIC_OR_DEPARTMENT_OTHER)
Admission: RE | Disposition: A | Discharge status: Home / Self Care | Payer: Self-pay | Source: Ambulatory Visit | Attending: Urology

## 2017-05-19 DIAGNOSIS — Z7984 Long term (current) use of oral hypoglycemic drugs: Secondary | ICD-10-CM | POA: Diagnosis not present

## 2017-05-19 DIAGNOSIS — E119 Type 2 diabetes mellitus without complications: Secondary | ICD-10-CM | POA: Insufficient documentation

## 2017-05-19 DIAGNOSIS — N201 Calculus of ureter: Secondary | ICD-10-CM | POA: Insufficient documentation

## 2017-05-19 DIAGNOSIS — Z87891 Personal history of nicotine dependence: Secondary | ICD-10-CM | POA: Diagnosis not present

## 2017-05-19 DIAGNOSIS — M1990 Unspecified osteoarthritis, unspecified site: Secondary | ICD-10-CM | POA: Diagnosis not present

## 2017-05-19 DIAGNOSIS — I10 Essential (primary) hypertension: Secondary | ICD-10-CM | POA: Insufficient documentation

## 2017-05-19 DIAGNOSIS — Z79899 Other long term (current) drug therapy: Secondary | ICD-10-CM | POA: Diagnosis not present

## 2017-05-19 DIAGNOSIS — M797 Fibromyalgia: Secondary | ICD-10-CM | POA: Insufficient documentation

## 2017-05-19 HISTORY — DX: Presence of spectacles and contact lenses: Z97.3

## 2017-05-19 HISTORY — DX: Other specified disorders of bone density and structure, unspecified site: M85.80

## 2017-05-19 HISTORY — DX: Presence of dental prosthetic device (complete) (partial): Z97.2

## 2017-05-19 HISTORY — DX: Personal history of urinary calculi: Z87.442

## 2017-05-19 HISTORY — DX: Adverse effect of unspecified anesthetic, initial encounter: T41.45XA

## 2017-05-19 HISTORY — DX: Hyperlipidemia, unspecified: E78.5

## 2017-05-19 HISTORY — DX: Other complications of anesthesia, initial encounter: T88.59XA

## 2017-05-19 HISTORY — DX: Unspecified macular degeneration: H35.30

## 2017-05-19 HISTORY — DX: Personal history of other specified conditions: Z87.898

## 2017-05-19 HISTORY — DX: Diverticulosis of large intestine without perforation or abscess without bleeding: K57.30

## 2017-05-19 HISTORY — DX: Calculus of ureter: N20.1

## 2017-05-19 HISTORY — DX: Personal history of colonic polyps: Z86.010

## 2017-05-19 HISTORY — DX: Type 2 diabetes mellitus without complications: E11.9

## 2017-05-19 HISTORY — PX: CYSTOSCOPY WITH URETEROSCOPY AND STENT PLACEMENT: SHX6377

## 2017-05-19 HISTORY — DX: Personal history of adenomatous and serrated colon polyps: Z86.0101

## 2017-05-19 LAB — POCT I-STAT, CHEM 8
BUN: 13 mg/dL (ref 6–20)
Calcium, Ion: 1.26 mmol/L (ref 1.15–1.40)
Chloride: 97 mmol/L — ABNORMAL LOW (ref 101–111)
Creatinine, Ser: 0.7 mg/dL (ref 0.44–1.00)
Glucose, Bld: 170 mg/dL — ABNORMAL HIGH (ref 65–99)
HCT: 37 % (ref 36.0–46.0)
Hemoglobin: 12.6 g/dL (ref 12.0–15.0)
Potassium: 3.5 mmol/L (ref 3.5–5.1)
Sodium: 143 mmol/L (ref 135–145)
TCO2: 28 mmol/L (ref 0–100)

## 2017-05-19 LAB — GLUCOSE, CAPILLARY: Glucose-Capillary: 163 mg/dL — ABNORMAL HIGH (ref 65–99)

## 2017-05-19 SURGERY — CYSTOURETEROSCOPY, WITH STENT INSERTION
Anesthesia: General | Site: Ureter | Laterality: Left

## 2017-05-19 MED ORDER — OXYCODONE HCL 5 MG PO TABS
5.0000 mg | ORAL_TABLET | ORAL | Status: DC | PRN
  Filled 2017-05-19: qty 2

## 2017-05-19 MED ORDER — CEFAZOLIN SODIUM-DEXTROSE 2-4 GM/100ML-% IV SOLN
INTRAVENOUS | Status: AC
  Filled 2017-05-19: qty 100

## 2017-05-19 MED ORDER — DEXAMETHASONE SODIUM PHOSPHATE 10 MG/ML IJ SOLN
INTRAMUSCULAR | Status: AC
  Filled 2017-05-19: qty 1

## 2017-05-19 MED ORDER — OXYCODONE HCL 5 MG PO TABS
5.0000 mg | ORAL_TABLET | Freq: Once | ORAL | Status: AC | PRN
  Administered 2017-05-19: 5 mg via ORAL
  Filled 2017-05-19: qty 1

## 2017-05-19 MED ORDER — FENTANYL CITRATE (PF) 100 MCG/2ML IJ SOLN
INTRAMUSCULAR | Status: AC
  Filled 2017-05-19: qty 2

## 2017-05-19 MED ORDER — SODIUM CHLORIDE 0.9% FLUSH
3.0000 mL | Freq: Two times a day (BID) | INTRAVENOUS | Status: DC
  Filled 2017-05-19: qty 3

## 2017-05-19 MED ORDER — HYDROMORPHONE HCL 1 MG/ML IJ SOLN
0.2500 mg | INTRAMUSCULAR | Status: DC | PRN
  Administered 2017-05-19: 0.5 mg via INTRAVENOUS
  Filled 2017-05-19: qty 0.5

## 2017-05-19 MED ORDER — LIDOCAINE 2% (20 MG/ML) 5 ML SYRINGE
INTRAMUSCULAR | Status: AC
  Filled 2017-05-19: qty 5

## 2017-05-19 MED ORDER — PROPOFOL 10 MG/ML IV BOLUS
INTRAVENOUS | Status: AC
  Filled 2017-05-19: qty 20

## 2017-05-19 MED ORDER — ACETAMINOPHEN 325 MG PO TABS
650.0000 mg | ORAL_TABLET | ORAL | Status: DC | PRN
  Filled 2017-05-19: qty 2

## 2017-05-19 MED ORDER — IOHEXOL 300 MG/ML  SOLN
INTRAMUSCULAR | Status: DC | PRN
  Administered 2017-05-19: 1 mL

## 2017-05-19 MED ORDER — ACETAMINOPHEN 650 MG RE SUPP
650.0000 mg | RECTAL | Status: DC | PRN
  Filled 2017-05-19: qty 1

## 2017-05-19 MED ORDER — ONDANSETRON HCL 4 MG/2ML IJ SOLN
INTRAMUSCULAR | Status: AC
  Filled 2017-05-19: qty 2

## 2017-05-19 MED ORDER — EPHEDRINE SULFATE-NACL 50-0.9 MG/10ML-% IV SOSY
PREFILLED_SYRINGE | INTRAVENOUS | Status: DC | PRN
  Administered 2017-05-19 (×2): 15 mg via INTRAVENOUS

## 2017-05-19 MED ORDER — SODIUM CHLORIDE 0.9 % IV SOLN
250.0000 mL | INTRAVENOUS | Status: DC | PRN
  Filled 2017-05-19: qty 250

## 2017-05-19 MED ORDER — MORPHINE SULFATE (PF) 2 MG/ML IV SOLN
2.0000 mg | INTRAVENOUS | Status: DC | PRN
  Filled 2017-05-19: qty 1

## 2017-05-19 MED ORDER — DEXAMETHASONE SODIUM PHOSPHATE 4 MG/ML IJ SOLN
INTRAMUSCULAR | Status: DC | PRN
  Administered 2017-05-19: 10 mg via INTRAVENOUS

## 2017-05-19 MED ORDER — CEFAZOLIN SODIUM-DEXTROSE 2-4 GM/100ML-% IV SOLN
2.0000 g | INTRAVENOUS | Status: AC
  Administered 2017-05-19: 2 g via INTRAVENOUS
  Filled 2017-05-19: qty 100

## 2017-05-19 MED ORDER — PROPOFOL 10 MG/ML IV BOLUS
INTRAVENOUS | Status: DC | PRN
  Administered 2017-05-19: 110 mg via INTRAVENOUS

## 2017-05-19 MED ORDER — HYDROMORPHONE HCL-NACL 0.5-0.9 MG/ML-% IV SOSY
PREFILLED_SYRINGE | INTRAVENOUS | Status: AC
  Filled 2017-05-19: qty 1

## 2017-05-19 MED ORDER — SODIUM CHLORIDE 0.9% FLUSH
3.0000 mL | INTRAVENOUS | Status: DC | PRN
  Filled 2017-05-19: qty 3

## 2017-05-19 MED ORDER — PROMETHAZINE HCL 25 MG/ML IJ SOLN
6.2500 mg | Freq: Once | INTRAMUSCULAR | Status: AC
  Administered 2017-05-19: 6.25 mg via INTRAVENOUS
  Filled 2017-05-19: qty 1

## 2017-05-19 MED ORDER — PROMETHAZINE HCL 25 MG/ML IJ SOLN
INTRAMUSCULAR | Status: AC
  Filled 2017-05-19: qty 1

## 2017-05-19 MED ORDER — OXYCODONE HCL 5 MG/5ML PO SOLN
5.0000 mg | Freq: Once | ORAL | Status: AC | PRN
  Filled 2017-05-19: qty 5

## 2017-05-19 MED ORDER — OXYCODONE HCL 5 MG PO TABS
ORAL_TABLET | ORAL | Status: AC
  Filled 2017-05-19: qty 1

## 2017-05-19 MED ORDER — KETOROLAC TROMETHAMINE 30 MG/ML IJ SOLN
INTRAMUSCULAR | Status: DC | PRN
  Administered 2017-05-19: 30 mg via INTRAVENOUS

## 2017-05-19 MED ORDER — ONDANSETRON HCL 4 MG/2ML IJ SOLN
INTRAMUSCULAR | Status: DC | PRN
  Administered 2017-05-19: 4 mg via INTRAVENOUS

## 2017-05-19 MED ORDER — KETOROLAC TROMETHAMINE 30 MG/ML IJ SOLN
INTRAMUSCULAR | Status: AC
  Filled 2017-05-19: qty 1

## 2017-05-19 MED ORDER — SODIUM CHLORIDE 0.9 % IR SOLN
Status: DC | PRN
  Administered 2017-05-19: 3000 mL via INTRAVESICAL

## 2017-05-19 MED ORDER — LACTATED RINGERS IV SOLN
INTRAVENOUS | Status: DC
  Administered 2017-05-19 (×2): via INTRAVENOUS
  Filled 2017-05-19: qty 1000

## 2017-05-19 MED ORDER — LIDOCAINE 2% (20 MG/ML) 5 ML SYRINGE
INTRAMUSCULAR | Status: DC | PRN
  Administered 2017-05-19: 80 mg via INTRAVENOUS

## 2017-05-19 SURGICAL SUPPLY — 35 items
BAG DRAIN URO-CYSTO SKYTR STRL (DRAIN) ×2 IMPLANT
BASKET LASER NITINOL 1.9FR (BASKET) IMPLANT
BASKET STNLS GEMINI 4WIRE 3FR (BASKET) IMPLANT
BASKET STONE 1.7 NGAGE (UROLOGICAL SUPPLIES) ×2 IMPLANT
BASKET ZERO TIP NITINOL 2.4FR (BASKET) IMPLANT
CATH URET 5FR 28IN CONE TIP (BALLOONS)
CATH URET 5FR 28IN OPEN ENDED (CATHETERS) IMPLANT
CATH URET 5FR 70CM CONE TIP (BALLOONS) IMPLANT
CLOTH BEACON ORANGE TIMEOUT ST (SAFETY) ×2 IMPLANT
ELECT REM PT RETURN 9FT ADLT (ELECTROSURGICAL)
ELECTRODE REM PT RTRN 9FT ADLT (ELECTROSURGICAL) IMPLANT
FIBER LASER FLEXIVA 1000 (UROLOGICAL SUPPLIES) IMPLANT
FIBER LASER FLEXIVA 365 (UROLOGICAL SUPPLIES) IMPLANT
FIBER LASER FLEXIVA 550 (UROLOGICAL SUPPLIES) IMPLANT
FIBER LASER TRAC TIP (UROLOGICAL SUPPLIES) IMPLANT
GLOVE SURG SS PI 8.0 STRL IVOR (GLOVE) ×2 IMPLANT
GOWN STRL REUS W/ TWL LRG LVL3 (GOWN DISPOSABLE) ×1 IMPLANT
GOWN STRL REUS W/ TWL XL LVL3 (GOWN DISPOSABLE) ×1 IMPLANT
GOWN STRL REUS W/TWL LRG LVL3 (GOWN DISPOSABLE) ×1
GOWN STRL REUS W/TWL XL LVL3 (GOWN DISPOSABLE) ×1
GUIDEWIRE 0.038 PTFE COATED (WIRE) IMPLANT
GUIDEWIRE ANG ZIPWIRE 038X150 (WIRE) IMPLANT
GUIDEWIRE ASNIS 2.0 100 NS (WIRE) ×2 IMPLANT
GUIDEWIRE STR DUAL SENSOR (WIRE) ×2 IMPLANT
IV NS IRRIG 3000ML ARTHROMATIC (IV SOLUTION) ×4 IMPLANT
KIT BALLIN UROMAX 15FX10 (LABEL) IMPLANT
KIT BALLN UROMAX 15FX4 (MISCELLANEOUS) IMPLANT
KIT BALLN UROMAX 26 75X4 (MISCELLANEOUS)
KIT RM TURNOVER CYSTO AR (KITS) ×2 IMPLANT
MANIFOLD NEPTUNE II (INSTRUMENTS) IMPLANT
PACK CYSTO (CUSTOM PROCEDURE TRAY) ×2 IMPLANT
SET HIGH PRES BAL DIL (LABEL)
SHEATH ACCESS URETERAL 24CM (SHEATH) ×2 IMPLANT
SHEATH ACCESS URETERAL 38CM (SHEATH) IMPLANT
TUBE CONNECTING 12X1/4 (SUCTIONS) IMPLANT

## 2017-05-19 NOTE — Anesthesia Procedure Notes (Signed)
Procedure Name: LMA Insertion Date/Time: 05/19/2017 9:03 AM Performed by: Wanita Chamberlain Pre-anesthesia Checklist: Patient identified, Timeout performed, Emergency Drugs available, Suction available and Patient being monitored Patient Re-evaluated:Patient Re-evaluated prior to inductionOxygen Delivery Method: Circle system utilized Preoxygenation: Pre-oxygenation with 100% oxygen Intubation Type: IV induction Ventilation: Mask ventilation without difficulty LMA: LMA inserted LMA Size: 4.0 Number of attempts: 1 Placement Confirmation: breath sounds checked- equal and bilateral and positive ETCO2 Tube secured with: Tape Dental Injury: Teeth and Oropharynx as per pre-operative assessment

## 2017-05-19 NOTE — Op Note (Signed)
NAME:  Ashley Dean, Ashley Dean                     ACCOUNT NO.:  MEDICAL RECORD NO.:  [id number]  LOCATION:                                 FACILITY:  PHYSICIAN:  Marshall Cork. Jeffie Pollock, M.D.         DATE OF BIRTH:  DATE OF PROCEDURE:  05/19/2017 DATE OF DISCHARGE:                              OPERATIVE REPORT   Patient of Dr. Irine Seal.  PROCEDURES: 1. Cystoscopy with left retrograde pyelogram and interpretation. 2. Left ureteroscopic stone extraction.  PREOPERATIVE DIAGNOSIS:  Left ureterovesical junction stone.  POSTOPERATIVE DIAGNOSIS:  Left ureterovesical junction stone.  SURGEON:  Marshall Cork. Jeffie Pollock, M.D.  ANESTHESIA:  General.  SPECIMEN:  Stone.  DRAIN:  None.  BLOOD LOSS:  None.  COMPLICATIONS:  None.  INDICATIONS:  Ms. Mcneil is a 76 year old white female, who previously had undergone left lithotripsy.  She has had a residual stone in the left distal ureter for several months and is to undergo ureteroscopic stone extraction.  FINDINGS AND PROCEDURE:  She was taken to the operating room, where a general anesthetic was induced.  She was given Ancef.  She was placed in the lithotomy position.  Her perineum and genitalia were prepped with Betadine solution and she was draped in the usual sterile fashion.  Cystoscopy was performed using the 23-French scope and 30-degree lens. Examination revealed normal urethra.  The bladder wall had mild trabeculation with no mucosal abnormalities.  Ureteral orifices were unremarkable.  The left ureteral orifice was cannulated with a 5-French open-end catheter and contrast was instilled.  Left retrograde pyelogram demonstrated a filling defect in the distal ureter consistent with a stone.  It was felt to be small enough that removal without laser was possible.  A 4.5-French single-lumen semirigid ureteroscope was then passed through the ureteral orifice to the level of the stone.  The stone was grasped with a NGage basket and an attempt was  made to remove it, although it was slightly too large to go through the impaction site.  A guidewire was then passed to the kidney.  The ureteroscope was removed and a 24 cm 12-French digital access sheath inner core was passed over the wire to dilate the distal ureter, passed easily.  The ureteroscope was then reinserted.  The stone was grasped with the NGage basket and removed intact.  The wire was then removed.  Final ureteroscopic inspection revealed no significant ureteral trauma, so it was felt no stent was indicated.  The bladder was then drained with the cystoscope sheath.  The patient was taken down from lithotomy position.  Her anesthetic was reversed. She was moved to the recovery room in stable condition.  There were no complications.     Marshall Cork. Jeffie Pollock, M.D.     JJW/MEDQ  D:  05/19/2017  T:  05/19/2017  Job:  [id number]

## 2017-05-19 NOTE — Interval H&P Note (Signed)
History and Physical Interval Note:  05/19/2017 8:49 AM  Ashley Dean  has presented today for surgery, with the diagnosis of LEFT URETEROVESICAL JUNCTION STONE  The various methods of treatment have been discussed with the patient and family. After consideration of risks, benefits and other options for treatment, the patient has consented to  Procedure(s): CYSTOSCOPY WITH URETEROSCOPY/ STONE EXTRACTION AND STENT PLACEMENT (Left) as a surgical intervention .  The patient's history has been reviewed, patient examined, no change in status, stable for surgery.  I have reviewed the patient's chart and labs.  Questions were answered to the patient's satisfaction.     Brinton Brandel J

## 2017-05-19 NOTE — Discharge Instructions (Signed)
CYSTOSCOPY HOME CARE INSTRUCTIONS  Activity: Rest for the remainder of the day.  Do not drive or operate equipment today.  You may resume normal activities in one to two days as instructed by your physician.   Meals: Drink plenty of liquids and eat light foods such as gelatin or soup this evening.  You may return to a normal meal plan tomorrow.  Return to Work: You may return to work in one to two days or as instructed by your physician.  Special Instructions / Symptoms: Call your physician if any of these symptoms occur:   -persistent or heavy bleeding  -bleeding which continues after first few urination  -large blood clots that are difficult to pass  -urine stream diminishes or stops completely  -fever equal to or higher than 101 degrees Farenheit.  -cloudy urine with a strong, foul odor  -severe pain  Females should always wipe from front to back after elimination.  You may feel some burning pain when you urinate.  This should disappear with time.  Applying moist heat to the lower abdomen or a hot tub bath may help relieve the pain. \  Bring the stone to the office to be sent for analysis.    Post Anesthesia Home Care Instructions  Activity: Get plenty of rest for the remainder of the day. A responsible individual must stay with you for 24 hours following the procedure.  For the next 24 hours, DO NOT: -Drive a car -Paediatric nurse -Drink alcoholic beverages -Take any medication unless instructed by your physician -Make any legal decisions or sign important papers.  Meals: Start with liquid foods such as gelatin or soup. Progress to regular foods as tolerated. Avoid greasy, spicy, heavy foods. If nausea and/or vomiting occur, drink only clear liquids until the nausea and/or vomiting subsides. Call your physician if vomiting continues.  Special Instructions/Symptoms: Your throat may feel dry or sore from the anesthesia or the breathing tube placed in your throat during  surgery. If this causes discomfort, gargle with warm salt water. The discomfort should disappear within 24 hours.  If you had a scopolamine patch placed behind your ear for the management of post- operative nausea and/or vomiting:  1. The medication in the patch is effective for 72 hours, after which it should be removed.  Wrap patch in a tissue and discard in the trash. Wash hands thoroughly with soap and water. 2. You may remove the patch earlier than 72 hours if you experience unpleasant side effects which may include dry mouth, dizziness or visual disturbances. 3. Avoid touching the patch. Wash your hands with soap and water after contact with the patch.

## 2017-05-19 NOTE — Transfer of Care (Signed)
Immediate Anesthesia Transfer of Care Note  Patient: Ashley Dean  Procedure(s) Performed: Procedure(s): CYSTOSCOPY WITH URETEROSCOPY/ STONE EXTRACTION AND STENT PLACEMENT (Left)  Patient Location: PACU  Anesthesia Type:General  Level of Consciousness: awake, alert , oriented and patient cooperative  Airway & Oxygen Therapy: Patient Spontanous Breathing and Patient connected to nasal cannula oxygen  Post-op Assessment: Report given to RN and Post -op Vital signs reviewed and stable  Post vital signs: Reviewed and stable  Last Vitals:  Vitals:   05/19/17 0725  BP: 131/84  Pulse: 62  Resp: 16  Temp: 36.9 C    Last Pain:  Vitals:   05/19/17 0751  TempSrc:   PainSc: 6       Patients Stated Pain Goal: 5 (83/16/74 2552)  Complications: No apparent anesthesia complications

## 2017-05-19 NOTE — Anesthesia Preprocedure Evaluation (Addendum)
Anesthesia Evaluation  Patient identified by MRN, date of birth, ID band Patient awake    Reviewed: Allergy & Precautions, NPO status , Patient's Chart, lab work & pertinent test results  Airway Mallampati: I  TM Distance: >3 FB Neck ROM: Limited    Dental  (+) Edentulous Upper, Dental Advisory Given   Pulmonary former smoker,    breath sounds clear to auscultation       Cardiovascular hypertension,  Rhythm:Regular Rate:Normal     Neuro/Psych    GI/Hepatic   Endo/Other  diabetes  Renal/GU      Musculoskeletal  (+) Arthritis , Fibromyalgia -  Abdominal   Peds  Hematology   Anesthesia Other Findings   Reproductive/Obstetrics                            Anesthesia Physical Anesthesia Plan  ASA: III  Anesthesia Plan: General   Post-op Pain Management:    Induction: Intravenous  PONV Risk Score and Plan: 4 or greater and Ondansetron, Dexamethasone, Propofol, Midazolam, Scopolamine patch - Pre-op and Treatment may vary due to age or medical condition  Airway Management Planned: LMA  Additional Equipment:   Intra-op Plan:   Post-operative Plan: Extubation in OR  Informed Consent: I have reviewed the patients History and Physical, chart, labs and discussed the procedure including the risks, benefits and alternatives for the proposed anesthesia with the patient or authorized representative who has indicated his/her understanding and acceptance.     Plan Discussed with: CRNA  Anesthesia Plan Comments:         Anesthesia Quick Evaluation

## 2017-05-19 NOTE — Brief Op Note (Signed)
05/19/2017  9:20 AM  PATIENT:  Ashley Dean  76 y.o. female  PRE-OPERATIVE DIAGNOSIS:  LEFT URETEROVESICAL JUNCTION STONE  POST-OPERATIVE DIAGNOSIS:  LEFT URETEROVESICAL JUNCTION STONE  PROCEDURE:  Procedure(s): CYSTOSCOPY WITH  LEFT RETROGRADE PYELOGRAM AND URETEROSCOPY/ STONE EXTRACTION (Left)  SURGEON:  Surgeon(s) and Role:    * Irine Seal, MD - Primary  PHYSICIAN ASSISTANT:   ASSISTANTS: none   ANESTHESIA:   general  EBL:  Total I/O In: 500 [I.V.:500] Out: -   BLOOD ADMINISTERED:none  DRAINS: none   LOCAL MEDICATIONS USED:  NONE  SPECIMEN:  Source of Specimen:  LEFT URETERAL STONE  DISPOSITION OF SPECIMEN:  TO PATIENT  COUNTS:  YES  TOURNIQUET:  * No tourniquets in log *  DICTATION: .Other Dictation: Dictation Number [id number]  PLAN OF CARE: Discharge to home after PACU  PATIENT DISPOSITION:  PACU - hemodynamically stable.   Delay start of Pharmacological VTE agent (>24hrs) due to surgical blood loss or risk of bleeding: not applicable

## 2017-05-19 NOTE — Anesthesia Postprocedure Evaluation (Signed)
Anesthesia Post Note  Patient: Ashley Dean  Procedure(s) Performed: Procedure(s) (LRB): CYSTOSCOPY WITH URETEROSCOPY/ STONE EXTRACTION AND STENT PLACEMENT (Left)     Patient location during evaluation: PACU Anesthesia Type: General Level of consciousness: awake and sedated Pain management: pain level controlled Vital Signs Assessment: post-procedure vital signs reviewed and stable Respiratory status: spontaneous breathing, nonlabored ventilation, respiratory function stable and patient connected to nasal cannula oxygen Cardiovascular status: blood pressure returned to baseline and stable Postop Assessment: no signs of nausea or vomiting Anesthetic complications: no    Last Vitals:  Vitals:   05/19/17 1100 05/19/17 1115  BP: (!) 161/82 (!) 119/97  Pulse: 68 71  Resp: 17 17  Temp:      Last Pain:  Vitals:   05/19/17 1045  TempSrc:   PainSc: Asleep                 Sebastiana Wuest,JAMES TERRILL

## 2017-05-21 ENCOUNTER — Encounter (HOSPITAL_BASED_OUTPATIENT_CLINIC_OR_DEPARTMENT_OTHER): Payer: Self-pay | Admitting: Urology

## 2017-05-29 DIAGNOSIS — Z87442 Personal history of urinary calculi: Secondary | ICD-10-CM | POA: Diagnosis not present

## 2017-05-29 DIAGNOSIS — N200 Calculus of kidney: Secondary | ICD-10-CM | POA: Diagnosis not present

## 2017-06-22 DIAGNOSIS — I10 Essential (primary) hypertension: Secondary | ICD-10-CM | POA: Diagnosis not present

## 2017-06-22 DIAGNOSIS — N183 Chronic kidney disease, stage 3 (moderate): Secondary | ICD-10-CM | POA: Diagnosis not present

## 2017-06-22 DIAGNOSIS — Z0001 Encounter for general adult medical examination with abnormal findings: Secondary | ICD-10-CM | POA: Diagnosis not present

## 2017-06-22 DIAGNOSIS — E781 Pure hyperglyceridemia: Secondary | ICD-10-CM | POA: Diagnosis not present

## 2017-06-22 DIAGNOSIS — Z1389 Encounter for screening for other disorder: Secondary | ICD-10-CM | POA: Diagnosis not present

## 2017-06-22 DIAGNOSIS — M859 Disorder of bone density and structure, unspecified: Secondary | ICD-10-CM | POA: Diagnosis not present

## 2017-06-22 DIAGNOSIS — Z7984 Long term (current) use of oral hypoglycemic drugs: Secondary | ICD-10-CM | POA: Diagnosis not present

## 2017-06-22 DIAGNOSIS — M545 Low back pain: Secondary | ICD-10-CM | POA: Diagnosis not present

## 2017-06-22 DIAGNOSIS — E782 Mixed hyperlipidemia: Secondary | ICD-10-CM | POA: Diagnosis not present

## 2017-06-22 DIAGNOSIS — E1121 Type 2 diabetes mellitus with diabetic nephropathy: Secondary | ICD-10-CM | POA: Diagnosis not present

## 2017-06-22 DIAGNOSIS — M797 Fibromyalgia: Secondary | ICD-10-CM | POA: Diagnosis not present

## 2017-07-10 DIAGNOSIS — N201 Calculus of ureter: Secondary | ICD-10-CM | POA: Diagnosis not present

## 2017-07-14 DIAGNOSIS — M5136 Other intervertebral disc degeneration, lumbar region: Secondary | ICD-10-CM | POA: Diagnosis not present

## 2017-08-05 DIAGNOSIS — M8588 Other specified disorders of bone density and structure, other site: Secondary | ICD-10-CM | POA: Diagnosis not present

## 2017-09-21 DIAGNOSIS — M25571 Pain in right ankle and joints of right foot: Secondary | ICD-10-CM | POA: Diagnosis not present

## 2017-10-23 DIAGNOSIS — M25571 Pain in right ankle and joints of right foot: Secondary | ICD-10-CM | POA: Diagnosis not present

## 2017-11-27 DIAGNOSIS — M25571 Pain in right ankle and joints of right foot: Secondary | ICD-10-CM | POA: Diagnosis not present

## 2017-12-03 DIAGNOSIS — M6281 Muscle weakness (generalized): Secondary | ICD-10-CM | POA: Diagnosis not present

## 2017-12-03 DIAGNOSIS — M25571 Pain in right ankle and joints of right foot: Secondary | ICD-10-CM | POA: Diagnosis not present

## 2017-12-03 DIAGNOSIS — M25671 Stiffness of right ankle, not elsewhere classified: Secondary | ICD-10-CM | POA: Diagnosis not present

## 2017-12-03 DIAGNOSIS — M76821 Posterior tibial tendinitis, right leg: Secondary | ICD-10-CM | POA: Diagnosis not present

## 2017-12-08 DIAGNOSIS — M25571 Pain in right ankle and joints of right foot: Secondary | ICD-10-CM | POA: Diagnosis not present

## 2017-12-08 DIAGNOSIS — M25671 Stiffness of right ankle, not elsewhere classified: Secondary | ICD-10-CM | POA: Diagnosis not present

## 2017-12-08 DIAGNOSIS — M6281 Muscle weakness (generalized): Secondary | ICD-10-CM | POA: Diagnosis not present

## 2017-12-08 DIAGNOSIS — M76821 Posterior tibial tendinitis, right leg: Secondary | ICD-10-CM | POA: Diagnosis not present

## 2017-12-10 DIAGNOSIS — M25671 Stiffness of right ankle, not elsewhere classified: Secondary | ICD-10-CM | POA: Diagnosis not present

## 2017-12-10 DIAGNOSIS — M25571 Pain in right ankle and joints of right foot: Secondary | ICD-10-CM | POA: Diagnosis not present

## 2017-12-10 DIAGNOSIS — M76821 Posterior tibial tendinitis, right leg: Secondary | ICD-10-CM | POA: Diagnosis not present

## 2017-12-10 DIAGNOSIS — M6281 Muscle weakness (generalized): Secondary | ICD-10-CM | POA: Diagnosis not present

## 2017-12-15 DIAGNOSIS — M6281 Muscle weakness (generalized): Secondary | ICD-10-CM | POA: Diagnosis not present

## 2017-12-15 DIAGNOSIS — M76821 Posterior tibial tendinitis, right leg: Secondary | ICD-10-CM | POA: Diagnosis not present

## 2017-12-15 DIAGNOSIS — M25571 Pain in right ankle and joints of right foot: Secondary | ICD-10-CM | POA: Diagnosis not present

## 2017-12-15 DIAGNOSIS — M25671 Stiffness of right ankle, not elsewhere classified: Secondary | ICD-10-CM | POA: Diagnosis not present

## 2017-12-17 DIAGNOSIS — M25671 Stiffness of right ankle, not elsewhere classified: Secondary | ICD-10-CM | POA: Diagnosis not present

## 2017-12-17 DIAGNOSIS — M6281 Muscle weakness (generalized): Secondary | ICD-10-CM | POA: Diagnosis not present

## 2017-12-17 DIAGNOSIS — M25571 Pain in right ankle and joints of right foot: Secondary | ICD-10-CM | POA: Diagnosis not present

## 2017-12-17 DIAGNOSIS — M76821 Posterior tibial tendinitis, right leg: Secondary | ICD-10-CM | POA: Diagnosis not present

## 2017-12-21 DIAGNOSIS — M25571 Pain in right ankle and joints of right foot: Secondary | ICD-10-CM | POA: Diagnosis not present

## 2017-12-21 DIAGNOSIS — M6281 Muscle weakness (generalized): Secondary | ICD-10-CM | POA: Diagnosis not present

## 2017-12-21 DIAGNOSIS — M25671 Stiffness of right ankle, not elsewhere classified: Secondary | ICD-10-CM | POA: Diagnosis not present

## 2017-12-21 DIAGNOSIS — M76821 Posterior tibial tendinitis, right leg: Secondary | ICD-10-CM | POA: Diagnosis not present

## 2017-12-22 DIAGNOSIS — M25571 Pain in right ankle and joints of right foot: Secondary | ICD-10-CM | POA: Diagnosis not present

## 2017-12-22 DIAGNOSIS — M6281 Muscle weakness (generalized): Secondary | ICD-10-CM | POA: Diagnosis not present

## 2017-12-22 DIAGNOSIS — M25671 Stiffness of right ankle, not elsewhere classified: Secondary | ICD-10-CM | POA: Diagnosis not present

## 2017-12-22 DIAGNOSIS — M76821 Posterior tibial tendinitis, right leg: Secondary | ICD-10-CM | POA: Diagnosis not present

## 2017-12-29 DIAGNOSIS — M25671 Stiffness of right ankle, not elsewhere classified: Secondary | ICD-10-CM | POA: Diagnosis not present

## 2017-12-29 DIAGNOSIS — M76821 Posterior tibial tendinitis, right leg: Secondary | ICD-10-CM | POA: Diagnosis not present

## 2017-12-29 DIAGNOSIS — M6281 Muscle weakness (generalized): Secondary | ICD-10-CM | POA: Diagnosis not present

## 2017-12-29 DIAGNOSIS — M25571 Pain in right ankle and joints of right foot: Secondary | ICD-10-CM | POA: Diagnosis not present

## 2017-12-30 DIAGNOSIS — M76821 Posterior tibial tendinitis, right leg: Secondary | ICD-10-CM | POA: Diagnosis not present

## 2017-12-31 DIAGNOSIS — M76821 Posterior tibial tendinitis, right leg: Secondary | ICD-10-CM | POA: Diagnosis not present

## 2017-12-31 DIAGNOSIS — M6281 Muscle weakness (generalized): Secondary | ICD-10-CM | POA: Diagnosis not present

## 2017-12-31 DIAGNOSIS — M25671 Stiffness of right ankle, not elsewhere classified: Secondary | ICD-10-CM | POA: Diagnosis not present

## 2017-12-31 DIAGNOSIS — M25571 Pain in right ankle and joints of right foot: Secondary | ICD-10-CM | POA: Diagnosis not present

## 2018-01-11 DIAGNOSIS — M25671 Stiffness of right ankle, not elsewhere classified: Secondary | ICD-10-CM | POA: Diagnosis not present

## 2018-01-11 DIAGNOSIS — M76821 Posterior tibial tendinitis, right leg: Secondary | ICD-10-CM | POA: Diagnosis not present

## 2018-01-11 DIAGNOSIS — M6281 Muscle weakness (generalized): Secondary | ICD-10-CM | POA: Diagnosis not present

## 2018-01-11 DIAGNOSIS — M25571 Pain in right ankle and joints of right foot: Secondary | ICD-10-CM | POA: Diagnosis not present

## 2018-01-14 DIAGNOSIS — Z1231 Encounter for screening mammogram for malignant neoplasm of breast: Secondary | ICD-10-CM | POA: Diagnosis not present

## 2018-01-14 DIAGNOSIS — M6281 Muscle weakness (generalized): Secondary | ICD-10-CM | POA: Diagnosis not present

## 2018-01-14 DIAGNOSIS — M76821 Posterior tibial tendinitis, right leg: Secondary | ICD-10-CM | POA: Diagnosis not present

## 2018-01-14 DIAGNOSIS — M25671 Stiffness of right ankle, not elsewhere classified: Secondary | ICD-10-CM | POA: Diagnosis not present

## 2018-01-14 DIAGNOSIS — M25571 Pain in right ankle and joints of right foot: Secondary | ICD-10-CM | POA: Diagnosis not present

## 2018-01-18 DIAGNOSIS — N3941 Urge incontinence: Secondary | ICD-10-CM | POA: Diagnosis not present

## 2018-01-18 DIAGNOSIS — Z87442 Personal history of urinary calculi: Secondary | ICD-10-CM | POA: Diagnosis not present

## 2018-01-19 DIAGNOSIS — M25571 Pain in right ankle and joints of right foot: Secondary | ICD-10-CM | POA: Diagnosis not present

## 2018-01-19 DIAGNOSIS — M76821 Posterior tibial tendinitis, right leg: Secondary | ICD-10-CM | POA: Diagnosis not present

## 2018-01-19 DIAGNOSIS — M25671 Stiffness of right ankle, not elsewhere classified: Secondary | ICD-10-CM | POA: Diagnosis not present

## 2018-01-19 DIAGNOSIS — M6281 Muscle weakness (generalized): Secondary | ICD-10-CM | POA: Diagnosis not present

## 2018-01-21 DIAGNOSIS — M62831 Muscle spasm of calf: Secondary | ICD-10-CM | POA: Diagnosis not present

## 2018-01-21 DIAGNOSIS — M25571 Pain in right ankle and joints of right foot: Secondary | ICD-10-CM | POA: Diagnosis not present

## 2018-01-21 DIAGNOSIS — M25671 Stiffness of right ankle, not elsewhere classified: Secondary | ICD-10-CM | POA: Diagnosis not present

## 2018-01-21 DIAGNOSIS — M76821 Posterior tibial tendinitis, right leg: Secondary | ICD-10-CM | POA: Diagnosis not present

## 2018-01-25 DIAGNOSIS — M76821 Posterior tibial tendinitis, right leg: Secondary | ICD-10-CM | POA: Diagnosis not present

## 2018-01-25 DIAGNOSIS — M25671 Stiffness of right ankle, not elsewhere classified: Secondary | ICD-10-CM | POA: Diagnosis not present

## 2018-01-25 DIAGNOSIS — M25571 Pain in right ankle and joints of right foot: Secondary | ICD-10-CM | POA: Diagnosis not present

## 2018-01-25 DIAGNOSIS — M6281 Muscle weakness (generalized): Secondary | ICD-10-CM | POA: Diagnosis not present

## 2018-01-26 DIAGNOSIS — H10413 Chronic giant papillary conjunctivitis, bilateral: Secondary | ICD-10-CM | POA: Diagnosis not present

## 2018-01-27 DIAGNOSIS — I10 Essential (primary) hypertension: Secondary | ICD-10-CM | POA: Diagnosis not present

## 2018-01-27 DIAGNOSIS — N183 Chronic kidney disease, stage 3 (moderate): Secondary | ICD-10-CM | POA: Diagnosis not present

## 2018-01-27 DIAGNOSIS — Z7984 Long term (current) use of oral hypoglycemic drugs: Secondary | ICD-10-CM | POA: Diagnosis not present

## 2018-01-27 DIAGNOSIS — M797 Fibromyalgia: Secondary | ICD-10-CM | POA: Diagnosis not present

## 2018-01-27 DIAGNOSIS — E782 Mixed hyperlipidemia: Secondary | ICD-10-CM | POA: Diagnosis not present

## 2018-01-27 DIAGNOSIS — E1121 Type 2 diabetes mellitus with diabetic nephropathy: Secondary | ICD-10-CM | POA: Diagnosis not present

## 2018-01-28 DIAGNOSIS — M6281 Muscle weakness (generalized): Secondary | ICD-10-CM | POA: Diagnosis not present

## 2018-01-28 DIAGNOSIS — M25671 Stiffness of right ankle, not elsewhere classified: Secondary | ICD-10-CM | POA: Diagnosis not present

## 2018-01-28 DIAGNOSIS — M76821 Posterior tibial tendinitis, right leg: Secondary | ICD-10-CM | POA: Diagnosis not present

## 2018-01-28 DIAGNOSIS — M25571 Pain in right ankle and joints of right foot: Secondary | ICD-10-CM | POA: Diagnosis not present

## 2018-02-10 DIAGNOSIS — R42 Dizziness and giddiness: Secondary | ICD-10-CM | POA: Diagnosis not present

## 2018-02-10 DIAGNOSIS — H8111 Benign paroxysmal vertigo, right ear: Secondary | ICD-10-CM | POA: Diagnosis not present

## 2018-03-05 DIAGNOSIS — M5137 Other intervertebral disc degeneration, lumbosacral region: Secondary | ICD-10-CM | POA: Diagnosis not present

## 2018-03-18 DIAGNOSIS — M5416 Radiculopathy, lumbar region: Secondary | ICD-10-CM | POA: Diagnosis not present

## 2018-03-24 DIAGNOSIS — H8111 Benign paroxysmal vertigo, right ear: Secondary | ICD-10-CM | POA: Diagnosis not present

## 2018-03-24 DIAGNOSIS — R42 Dizziness and giddiness: Secondary | ICD-10-CM | POA: Diagnosis not present

## 2018-03-25 DIAGNOSIS — R51 Headache: Secondary | ICD-10-CM | POA: Diagnosis not present

## 2018-03-26 DIAGNOSIS — M5136 Other intervertebral disc degeneration, lumbar region: Secondary | ICD-10-CM | POA: Diagnosis not present

## 2018-04-01 DIAGNOSIS — M5416 Radiculopathy, lumbar region: Secondary | ICD-10-CM | POA: Diagnosis not present

## 2018-04-01 DIAGNOSIS — M5136 Other intervertebral disc degeneration, lumbar region: Secondary | ICD-10-CM | POA: Diagnosis not present

## 2018-04-14 DIAGNOSIS — H31091 Other chorioretinal scars, right eye: Secondary | ICD-10-CM | POA: Diagnosis not present

## 2018-04-14 DIAGNOSIS — E119 Type 2 diabetes mellitus without complications: Secondary | ICD-10-CM | POA: Diagnosis not present

## 2018-04-14 DIAGNOSIS — H353131 Nonexudative age-related macular degeneration, bilateral, early dry stage: Secondary | ICD-10-CM | POA: Diagnosis not present

## 2018-04-20 DIAGNOSIS — M5416 Radiculopathy, lumbar region: Secondary | ICD-10-CM | POA: Diagnosis not present

## 2018-05-05 DIAGNOSIS — M5136 Other intervertebral disc degeneration, lumbar region: Secondary | ICD-10-CM | POA: Diagnosis not present

## 2018-05-05 DIAGNOSIS — M5416 Radiculopathy, lumbar region: Secondary | ICD-10-CM | POA: Diagnosis not present

## 2018-05-17 DIAGNOSIS — M5416 Radiculopathy, lumbar region: Secondary | ICD-10-CM | POA: Diagnosis not present

## 2018-05-25 DIAGNOSIS — M5416 Radiculopathy, lumbar region: Secondary | ICD-10-CM | POA: Diagnosis not present

## 2018-06-01 DIAGNOSIS — M5416 Radiculopathy, lumbar region: Secondary | ICD-10-CM | POA: Diagnosis not present

## 2018-06-08 DIAGNOSIS — M5416 Radiculopathy, lumbar region: Secondary | ICD-10-CM | POA: Diagnosis not present

## 2018-06-15 DIAGNOSIS — M5416 Radiculopathy, lumbar region: Secondary | ICD-10-CM | POA: Diagnosis not present

## 2018-06-29 DIAGNOSIS — M5416 Radiculopathy, lumbar region: Secondary | ICD-10-CM | POA: Diagnosis not present

## 2018-09-06 DIAGNOSIS — I10 Essential (primary) hypertension: Secondary | ICD-10-CM | POA: Diagnosis not present

## 2018-09-06 DIAGNOSIS — N183 Chronic kidney disease, stage 3 (moderate): Secondary | ICD-10-CM | POA: Diagnosis not present

## 2018-09-06 DIAGNOSIS — Z1389 Encounter for screening for other disorder: Secondary | ICD-10-CM | POA: Diagnosis not present

## 2018-09-06 DIAGNOSIS — M797 Fibromyalgia: Secondary | ICD-10-CM | POA: Diagnosis not present

## 2018-09-06 DIAGNOSIS — E1121 Type 2 diabetes mellitus with diabetic nephropathy: Secondary | ICD-10-CM | POA: Diagnosis not present

## 2018-09-06 DIAGNOSIS — E1165 Type 2 diabetes mellitus with hyperglycemia: Secondary | ICD-10-CM | POA: Diagnosis not present

## 2018-09-06 DIAGNOSIS — F5101 Primary insomnia: Secondary | ICD-10-CM | POA: Diagnosis not present

## 2018-09-06 DIAGNOSIS — E782 Mixed hyperlipidemia: Secondary | ICD-10-CM | POA: Diagnosis not present

## 2018-09-06 DIAGNOSIS — Z7984 Long term (current) use of oral hypoglycemic drugs: Secondary | ICD-10-CM | POA: Diagnosis not present

## 2018-09-06 DIAGNOSIS — Z23 Encounter for immunization: Secondary | ICD-10-CM | POA: Diagnosis not present

## 2018-09-06 DIAGNOSIS — Z1211 Encounter for screening for malignant neoplasm of colon: Secondary | ICD-10-CM | POA: Diagnosis not present

## 2018-09-06 DIAGNOSIS — Z0000 Encounter for general adult medical examination without abnormal findings: Secondary | ICD-10-CM | POA: Diagnosis not present

## 2018-09-22 DIAGNOSIS — M62830 Muscle spasm of back: Secondary | ICD-10-CM | POA: Diagnosis not present

## 2018-09-22 DIAGNOSIS — M5136 Other intervertebral disc degeneration, lumbar region: Secondary | ICD-10-CM | POA: Diagnosis not present

## 2018-09-22 DIAGNOSIS — M5416 Radiculopathy, lumbar region: Secondary | ICD-10-CM | POA: Diagnosis not present

## 2018-10-21 DIAGNOSIS — M47816 Spondylosis without myelopathy or radiculopathy, lumbar region: Secondary | ICD-10-CM | POA: Diagnosis not present

## 2018-11-01 DIAGNOSIS — M48061 Spinal stenosis, lumbar region without neurogenic claudication: Secondary | ICD-10-CM | POA: Diagnosis not present

## 2018-11-25 DIAGNOSIS — M5136 Other intervertebral disc degeneration, lumbar region: Secondary | ICD-10-CM | POA: Diagnosis not present

## 2018-12-13 DIAGNOSIS — Z7984 Long term (current) use of oral hypoglycemic drugs: Secondary | ICD-10-CM | POA: Diagnosis not present

## 2018-12-13 DIAGNOSIS — E1121 Type 2 diabetes mellitus with diabetic nephropathy: Secondary | ICD-10-CM | POA: Diagnosis not present

## 2018-12-15 DIAGNOSIS — M48061 Spinal stenosis, lumbar region without neurogenic claudication: Secondary | ICD-10-CM | POA: Diagnosis not present

## 2018-12-15 DIAGNOSIS — M5416 Radiculopathy, lumbar region: Secondary | ICD-10-CM | POA: Diagnosis not present

## 2018-12-15 DIAGNOSIS — M5136 Other intervertebral disc degeneration, lumbar region: Secondary | ICD-10-CM | POA: Diagnosis not present

## 2018-12-15 DIAGNOSIS — M47816 Spondylosis without myelopathy or radiculopathy, lumbar region: Secondary | ICD-10-CM | POA: Diagnosis not present

## 2018-12-24 DIAGNOSIS — M5416 Radiculopathy, lumbar region: Secondary | ICD-10-CM | POA: Diagnosis not present

## 2018-12-24 DIAGNOSIS — M5136 Other intervertebral disc degeneration, lumbar region: Secondary | ICD-10-CM | POA: Diagnosis not present

## 2018-12-24 DIAGNOSIS — M4180 Other forms of scoliosis, site unspecified: Secondary | ICD-10-CM | POA: Diagnosis not present

## 2019-01-19 DIAGNOSIS — M48061 Spinal stenosis, lumbar region without neurogenic claudication: Secondary | ICD-10-CM | POA: Diagnosis not present

## 2019-01-27 DIAGNOSIS — N200 Calculus of kidney: Secondary | ICD-10-CM | POA: Diagnosis not present

## 2019-03-08 DIAGNOSIS — Z7984 Long term (current) use of oral hypoglycemic drugs: Secondary | ICD-10-CM | POA: Diagnosis not present

## 2019-03-08 DIAGNOSIS — E782 Mixed hyperlipidemia: Secondary | ICD-10-CM | POA: Diagnosis not present

## 2019-03-08 DIAGNOSIS — I10 Essential (primary) hypertension: Secondary | ICD-10-CM | POA: Diagnosis not present

## 2019-03-08 DIAGNOSIS — M797 Fibromyalgia: Secondary | ICD-10-CM | POA: Diagnosis not present

## 2019-03-08 DIAGNOSIS — E1165 Type 2 diabetes mellitus with hyperglycemia: Secondary | ICD-10-CM | POA: Diagnosis not present

## 2019-03-08 DIAGNOSIS — N183 Chronic kidney disease, stage 3 (moderate): Secondary | ICD-10-CM | POA: Diagnosis not present

## 2019-05-25 DIAGNOSIS — M5416 Radiculopathy, lumbar region: Secondary | ICD-10-CM | POA: Diagnosis not present

## 2019-05-25 DIAGNOSIS — M5136 Other intervertebral disc degeneration, lumbar region: Secondary | ICD-10-CM | POA: Diagnosis not present

## 2019-08-31 DIAGNOSIS — M5416 Radiculopathy, lumbar region: Secondary | ICD-10-CM | POA: Diagnosis not present

## 2019-08-31 DIAGNOSIS — M47896 Other spondylosis, lumbar region: Secondary | ICD-10-CM | POA: Diagnosis not present

## 2019-08-31 DIAGNOSIS — M533 Sacrococcygeal disorders, not elsewhere classified: Secondary | ICD-10-CM | POA: Diagnosis not present

## 2019-08-31 DIAGNOSIS — M5136 Other intervertebral disc degeneration, lumbar region: Secondary | ICD-10-CM | POA: Diagnosis not present

## 2019-09-08 DIAGNOSIS — Z0000 Encounter for general adult medical examination without abnormal findings: Secondary | ICD-10-CM | POA: Diagnosis not present

## 2019-09-08 DIAGNOSIS — E1165 Type 2 diabetes mellitus with hyperglycemia: Secondary | ICD-10-CM | POA: Diagnosis not present

## 2019-09-08 DIAGNOSIS — F5101 Primary insomnia: Secondary | ICD-10-CM | POA: Diagnosis not present

## 2019-09-08 DIAGNOSIS — K219 Gastro-esophageal reflux disease without esophagitis: Secondary | ICD-10-CM | POA: Diagnosis not present

## 2019-09-08 DIAGNOSIS — M797 Fibromyalgia: Secondary | ICD-10-CM | POA: Diagnosis not present

## 2019-09-08 DIAGNOSIS — I10 Essential (primary) hypertension: Secondary | ICD-10-CM | POA: Diagnosis not present

## 2019-09-08 DIAGNOSIS — Z1389 Encounter for screening for other disorder: Secondary | ICD-10-CM | POA: Diagnosis not present

## 2019-09-08 DIAGNOSIS — M48061 Spinal stenosis, lumbar region without neurogenic claudication: Secondary | ICD-10-CM | POA: Diagnosis not present

## 2019-09-08 DIAGNOSIS — E782 Mixed hyperlipidemia: Secondary | ICD-10-CM | POA: Diagnosis not present

## 2019-09-21 DIAGNOSIS — Z1231 Encounter for screening mammogram for malignant neoplasm of breast: Secondary | ICD-10-CM | POA: Diagnosis not present

## 2019-10-10 DIAGNOSIS — M5416 Radiculopathy, lumbar region: Secondary | ICD-10-CM | POA: Diagnosis not present

## 2019-10-10 DIAGNOSIS — M48061 Spinal stenosis, lumbar region without neurogenic claudication: Secondary | ICD-10-CM | POA: Diagnosis not present

## 2019-12-05 DIAGNOSIS — M542 Cervicalgia: Secondary | ICD-10-CM | POA: Diagnosis not present

## 2019-12-05 DIAGNOSIS — M25511 Pain in right shoulder: Secondary | ICD-10-CM | POA: Diagnosis not present

## 2019-12-08 DIAGNOSIS — M25512 Pain in left shoulder: Secondary | ICD-10-CM | POA: Diagnosis not present

## 2019-12-12 DIAGNOSIS — M7581 Other shoulder lesions, right shoulder: Secondary | ICD-10-CM | POA: Diagnosis not present

## 2019-12-14 DIAGNOSIS — M25611 Stiffness of right shoulder, not elsewhere classified: Secondary | ICD-10-CM | POA: Diagnosis not present

## 2019-12-14 DIAGNOSIS — S46011D Strain of muscle(s) and tendon(s) of the rotator cuff of right shoulder, subsequent encounter: Secondary | ICD-10-CM | POA: Diagnosis not present

## 2019-12-14 DIAGNOSIS — M25511 Pain in right shoulder: Secondary | ICD-10-CM | POA: Diagnosis not present

## 2019-12-14 DIAGNOSIS — M6281 Muscle weakness (generalized): Secondary | ICD-10-CM | POA: Diagnosis not present

## 2019-12-16 DIAGNOSIS — M25511 Pain in right shoulder: Secondary | ICD-10-CM | POA: Diagnosis not present

## 2019-12-16 DIAGNOSIS — M25611 Stiffness of right shoulder, not elsewhere classified: Secondary | ICD-10-CM | POA: Diagnosis not present

## 2019-12-16 DIAGNOSIS — S46011D Strain of muscle(s) and tendon(s) of the rotator cuff of right shoulder, subsequent encounter: Secondary | ICD-10-CM | POA: Diagnosis not present

## 2019-12-16 DIAGNOSIS — M6281 Muscle weakness (generalized): Secondary | ICD-10-CM | POA: Diagnosis not present

## 2019-12-19 DIAGNOSIS — S46011D Strain of muscle(s) and tendon(s) of the rotator cuff of right shoulder, subsequent encounter: Secondary | ICD-10-CM | POA: Diagnosis not present

## 2019-12-19 DIAGNOSIS — M25611 Stiffness of right shoulder, not elsewhere classified: Secondary | ICD-10-CM | POA: Diagnosis not present

## 2019-12-19 DIAGNOSIS — M25511 Pain in right shoulder: Secondary | ICD-10-CM | POA: Diagnosis not present

## 2019-12-19 DIAGNOSIS — M6281 Muscle weakness (generalized): Secondary | ICD-10-CM | POA: Diagnosis not present

## 2019-12-21 DIAGNOSIS — M25511 Pain in right shoulder: Secondary | ICD-10-CM | POA: Diagnosis not present

## 2019-12-21 DIAGNOSIS — M25611 Stiffness of right shoulder, not elsewhere classified: Secondary | ICD-10-CM | POA: Diagnosis not present

## 2019-12-21 DIAGNOSIS — M6281 Muscle weakness (generalized): Secondary | ICD-10-CM | POA: Diagnosis not present

## 2019-12-21 DIAGNOSIS — S46011D Strain of muscle(s) and tendon(s) of the rotator cuff of right shoulder, subsequent encounter: Secondary | ICD-10-CM | POA: Diagnosis not present

## 2020-01-02 DIAGNOSIS — M25611 Stiffness of right shoulder, not elsewhere classified: Secondary | ICD-10-CM | POA: Diagnosis not present

## 2020-01-02 DIAGNOSIS — S46011D Strain of muscle(s) and tendon(s) of the rotator cuff of right shoulder, subsequent encounter: Secondary | ICD-10-CM | POA: Diagnosis not present

## 2020-01-02 DIAGNOSIS — M6281 Muscle weakness (generalized): Secondary | ICD-10-CM | POA: Diagnosis not present

## 2020-01-02 DIAGNOSIS — M25511 Pain in right shoulder: Secondary | ICD-10-CM | POA: Diagnosis not present

## 2020-01-06 DIAGNOSIS — M6281 Muscle weakness (generalized): Secondary | ICD-10-CM | POA: Diagnosis not present

## 2020-01-06 DIAGNOSIS — M25511 Pain in right shoulder: Secondary | ICD-10-CM | POA: Diagnosis not present

## 2020-01-06 DIAGNOSIS — M25611 Stiffness of right shoulder, not elsewhere classified: Secondary | ICD-10-CM | POA: Diagnosis not present

## 2020-01-06 DIAGNOSIS — S46011D Strain of muscle(s) and tendon(s) of the rotator cuff of right shoulder, subsequent encounter: Secondary | ICD-10-CM | POA: Diagnosis not present

## 2020-01-10 DIAGNOSIS — M6281 Muscle weakness (generalized): Secondary | ICD-10-CM | POA: Diagnosis not present

## 2020-01-10 DIAGNOSIS — S46011D Strain of muscle(s) and tendon(s) of the rotator cuff of right shoulder, subsequent encounter: Secondary | ICD-10-CM | POA: Diagnosis not present

## 2020-01-10 DIAGNOSIS — M25611 Stiffness of right shoulder, not elsewhere classified: Secondary | ICD-10-CM | POA: Diagnosis not present

## 2020-01-10 DIAGNOSIS — M25511 Pain in right shoulder: Secondary | ICD-10-CM | POA: Diagnosis not present

## 2020-01-12 DIAGNOSIS — S46011D Strain of muscle(s) and tendon(s) of the rotator cuff of right shoulder, subsequent encounter: Secondary | ICD-10-CM | POA: Diagnosis not present

## 2020-01-12 DIAGNOSIS — M25511 Pain in right shoulder: Secondary | ICD-10-CM | POA: Diagnosis not present

## 2020-01-12 DIAGNOSIS — M25611 Stiffness of right shoulder, not elsewhere classified: Secondary | ICD-10-CM | POA: Diagnosis not present

## 2020-01-12 DIAGNOSIS — M6281 Muscle weakness (generalized): Secondary | ICD-10-CM | POA: Diagnosis not present

## 2020-01-17 DIAGNOSIS — S46011D Strain of muscle(s) and tendon(s) of the rotator cuff of right shoulder, subsequent encounter: Secondary | ICD-10-CM | POA: Diagnosis not present

## 2020-01-17 DIAGNOSIS — M6281 Muscle weakness (generalized): Secondary | ICD-10-CM | POA: Diagnosis not present

## 2020-01-17 DIAGNOSIS — M25511 Pain in right shoulder: Secondary | ICD-10-CM | POA: Diagnosis not present

## 2020-01-17 DIAGNOSIS — M25611 Stiffness of right shoulder, not elsewhere classified: Secondary | ICD-10-CM | POA: Diagnosis not present

## 2020-01-19 DIAGNOSIS — M25511 Pain in right shoulder: Secondary | ICD-10-CM | POA: Diagnosis not present

## 2020-01-19 DIAGNOSIS — S46011D Strain of muscle(s) and tendon(s) of the rotator cuff of right shoulder, subsequent encounter: Secondary | ICD-10-CM | POA: Diagnosis not present

## 2020-01-19 DIAGNOSIS — M25611 Stiffness of right shoulder, not elsewhere classified: Secondary | ICD-10-CM | POA: Diagnosis not present

## 2020-01-19 DIAGNOSIS — M6281 Muscle weakness (generalized): Secondary | ICD-10-CM | POA: Diagnosis not present

## 2020-01-23 DIAGNOSIS — M25511 Pain in right shoulder: Secondary | ICD-10-CM | POA: Diagnosis not present

## 2020-02-02 DIAGNOSIS — M48061 Spinal stenosis, lumbar region without neurogenic claudication: Secondary | ICD-10-CM | POA: Diagnosis not present

## 2020-02-02 DIAGNOSIS — E1165 Type 2 diabetes mellitus with hyperglycemia: Secondary | ICD-10-CM | POA: Diagnosis not present

## 2020-02-02 DIAGNOSIS — M797 Fibromyalgia: Secondary | ICD-10-CM | POA: Diagnosis not present

## 2020-02-02 DIAGNOSIS — E782 Mixed hyperlipidemia: Secondary | ICD-10-CM | POA: Diagnosis not present

## 2020-02-02 DIAGNOSIS — I10 Essential (primary) hypertension: Secondary | ICD-10-CM | POA: Diagnosis not present

## 2020-02-05 DIAGNOSIS — Z20828 Contact with and (suspected) exposure to other viral communicable diseases: Secondary | ICD-10-CM | POA: Diagnosis not present

## 2020-02-05 DIAGNOSIS — J019 Acute sinusitis, unspecified: Secondary | ICD-10-CM | POA: Diagnosis not present

## 2020-02-05 DIAGNOSIS — J309 Allergic rhinitis, unspecified: Secondary | ICD-10-CM | POA: Diagnosis not present

## 2020-02-22 DIAGNOSIS — N200 Calculus of kidney: Secondary | ICD-10-CM | POA: Diagnosis not present

## 2020-02-22 DIAGNOSIS — N3941 Urge incontinence: Secondary | ICD-10-CM | POA: Diagnosis not present

## 2020-04-11 DIAGNOSIS — M25511 Pain in right shoulder: Secondary | ICD-10-CM | POA: Diagnosis not present

## 2020-06-05 DIAGNOSIS — L980 Pyogenic granuloma: Secondary | ICD-10-CM | POA: Diagnosis not present

## 2020-06-05 DIAGNOSIS — Z961 Presence of intraocular lens: Secondary | ICD-10-CM | POA: Diagnosis not present

## 2020-06-05 DIAGNOSIS — H353132 Nonexudative age-related macular degeneration, bilateral, intermediate dry stage: Secondary | ICD-10-CM | POA: Diagnosis not present

## 2020-06-20 DIAGNOSIS — M48061 Spinal stenosis, lumbar region without neurogenic claudication: Secondary | ICD-10-CM | POA: Diagnosis not present

## 2020-09-04 DIAGNOSIS — Z23 Encounter for immunization: Secondary | ICD-10-CM | POA: Diagnosis not present

## 2020-09-10 DIAGNOSIS — F5101 Primary insomnia: Secondary | ICD-10-CM | POA: Diagnosis not present

## 2020-09-10 DIAGNOSIS — Z0000 Encounter for general adult medical examination without abnormal findings: Secondary | ICD-10-CM | POA: Diagnosis not present

## 2020-09-10 DIAGNOSIS — Z1389 Encounter for screening for other disorder: Secondary | ICD-10-CM | POA: Diagnosis not present

## 2020-09-10 DIAGNOSIS — E782 Mixed hyperlipidemia: Secondary | ICD-10-CM | POA: Diagnosis not present

## 2020-09-10 DIAGNOSIS — M48061 Spinal stenosis, lumbar region without neurogenic claudication: Secondary | ICD-10-CM | POA: Diagnosis not present

## 2020-09-10 DIAGNOSIS — N183 Chronic kidney disease, stage 3 unspecified: Secondary | ICD-10-CM | POA: Diagnosis not present

## 2020-09-10 DIAGNOSIS — E1121 Type 2 diabetes mellitus with diabetic nephropathy: Secondary | ICD-10-CM | POA: Diagnosis not present

## 2020-09-10 DIAGNOSIS — M797 Fibromyalgia: Secondary | ICD-10-CM | POA: Diagnosis not present

## 2020-09-10 DIAGNOSIS — I10 Essential (primary) hypertension: Secondary | ICD-10-CM | POA: Diagnosis not present

## 2020-09-10 DIAGNOSIS — Z7984 Long term (current) use of oral hypoglycemic drugs: Secondary | ICD-10-CM | POA: Diagnosis not present

## 2020-09-19 DIAGNOSIS — M5136 Other intervertebral disc degeneration, lumbar region: Secondary | ICD-10-CM | POA: Diagnosis not present

## 2020-09-19 DIAGNOSIS — M4180 Other forms of scoliosis, site unspecified: Secondary | ICD-10-CM | POA: Diagnosis not present

## 2020-09-24 DIAGNOSIS — R42 Dizziness and giddiness: Secondary | ICD-10-CM | POA: Diagnosis not present

## 2020-09-24 DIAGNOSIS — H8111 Benign paroxysmal vertigo, right ear: Secondary | ICD-10-CM | POA: Diagnosis not present

## 2020-10-02 DIAGNOSIS — Z1231 Encounter for screening mammogram for malignant neoplasm of breast: Secondary | ICD-10-CM | POA: Diagnosis not present

## 2020-10-18 DIAGNOSIS — R42 Dizziness and giddiness: Secondary | ICD-10-CM | POA: Diagnosis not present

## 2020-10-18 DIAGNOSIS — H838X3 Other specified diseases of inner ear, bilateral: Secondary | ICD-10-CM | POA: Diagnosis not present

## 2020-10-18 DIAGNOSIS — H903 Sensorineural hearing loss, bilateral: Secondary | ICD-10-CM | POA: Diagnosis not present

## 2020-10-18 DIAGNOSIS — H8111 Benign paroxysmal vertigo, right ear: Secondary | ICD-10-CM | POA: Diagnosis not present

## 2020-11-14 DIAGNOSIS — M5459 Other low back pain: Secondary | ICD-10-CM | POA: Diagnosis not present

## 2021-01-05 DIAGNOSIS — Z20828 Contact with and (suspected) exposure to other viral communicable diseases: Secondary | ICD-10-CM | POA: Diagnosis not present

## 2021-02-19 DIAGNOSIS — N200 Calculus of kidney: Secondary | ICD-10-CM | POA: Diagnosis not present

## 2021-02-19 DIAGNOSIS — K5900 Constipation, unspecified: Secondary | ICD-10-CM | POA: Diagnosis not present

## 2021-03-14 DIAGNOSIS — N183 Chronic kidney disease, stage 3 unspecified: Secondary | ICD-10-CM | POA: Diagnosis not present

## 2021-03-14 DIAGNOSIS — Z7984 Long term (current) use of oral hypoglycemic drugs: Secondary | ICD-10-CM | POA: Diagnosis not present

## 2021-03-14 DIAGNOSIS — F4321 Adjustment disorder with depressed mood: Secondary | ICD-10-CM | POA: Diagnosis not present

## 2021-03-14 DIAGNOSIS — E1121 Type 2 diabetes mellitus with diabetic nephropathy: Secondary | ICD-10-CM | POA: Diagnosis not present

## 2021-03-14 DIAGNOSIS — I10 Essential (primary) hypertension: Secondary | ICD-10-CM | POA: Diagnosis not present

## 2021-03-14 DIAGNOSIS — F5101 Primary insomnia: Secondary | ICD-10-CM | POA: Diagnosis not present

## 2021-03-14 DIAGNOSIS — E782 Mixed hyperlipidemia: Secondary | ICD-10-CM | POA: Diagnosis not present

## 2021-04-04 DIAGNOSIS — M1388 Other specified arthritis, other site: Secondary | ICD-10-CM | POA: Diagnosis not present

## 2021-04-04 DIAGNOSIS — M545 Low back pain, unspecified: Secondary | ICD-10-CM | POA: Diagnosis not present

## 2021-04-04 DIAGNOSIS — M791 Myalgia, unspecified site: Secondary | ICD-10-CM | POA: Diagnosis not present

## 2021-05-14 DIAGNOSIS — F324 Major depressive disorder, single episode, in partial remission: Secondary | ICD-10-CM | POA: Diagnosis not present

## 2021-05-28 DIAGNOSIS — H35443 Age-related reticular degeneration of retina, bilateral: Secondary | ICD-10-CM | POA: Diagnosis not present

## 2021-05-28 DIAGNOSIS — Z961 Presence of intraocular lens: Secondary | ICD-10-CM | POA: Diagnosis not present

## 2021-05-28 DIAGNOSIS — H353132 Nonexudative age-related macular degeneration, bilateral, intermediate dry stage: Secondary | ICD-10-CM | POA: Diagnosis not present

## 2021-05-28 DIAGNOSIS — E119 Type 2 diabetes mellitus without complications: Secondary | ICD-10-CM | POA: Diagnosis not present

## 2021-05-28 DIAGNOSIS — L980 Pyogenic granuloma: Secondary | ICD-10-CM | POA: Diagnosis not present

## 2021-06-04 ENCOUNTER — Emergency Department (HOSPITAL_BASED_OUTPATIENT_CLINIC_OR_DEPARTMENT_OTHER): Payer: Medicare Other

## 2021-06-04 ENCOUNTER — Other Ambulatory Visit: Payer: Self-pay

## 2021-06-04 ENCOUNTER — Emergency Department (HOSPITAL_BASED_OUTPATIENT_CLINIC_OR_DEPARTMENT_OTHER)
Admission: EM | Admit: 2021-06-04 | Discharge: 2021-06-04 | Disposition: A | Discharge status: Home / Self Care | Payer: Medicare Other | Attending: Emergency Medicine | Admitting: Emergency Medicine

## 2021-06-04 ENCOUNTER — Encounter (HOSPITAL_BASED_OUTPATIENT_CLINIC_OR_DEPARTMENT_OTHER): Payer: Self-pay | Admitting: *Deleted

## 2021-06-04 DIAGNOSIS — S2241XA Multiple fractures of ribs, right side, initial encounter for closed fracture: Secondary | ICD-10-CM | POA: Diagnosis not present

## 2021-06-04 DIAGNOSIS — S31119A Laceration without foreign body of abdominal wall, unspecified quadrant without penetration into peritoneal cavity, initial encounter: Secondary | ICD-10-CM | POA: Diagnosis not present

## 2021-06-04 DIAGNOSIS — R10811 Right upper quadrant abdominal tenderness: Secondary | ICD-10-CM | POA: Diagnosis not present

## 2021-06-04 DIAGNOSIS — R0789 Other chest pain: Secondary | ICD-10-CM | POA: Diagnosis not present

## 2021-06-04 DIAGNOSIS — S0990XA Unspecified injury of head, initial encounter: Secondary | ICD-10-CM | POA: Diagnosis not present

## 2021-06-04 DIAGNOSIS — Z7984 Long term (current) use of oral hypoglycemic drugs: Secondary | ICD-10-CM | POA: Insufficient documentation

## 2021-06-04 DIAGNOSIS — Z79899 Other long term (current) drug therapy: Secondary | ICD-10-CM | POA: Diagnosis not present

## 2021-06-04 DIAGNOSIS — I10 Essential (primary) hypertension: Secondary | ICD-10-CM | POA: Insufficient documentation

## 2021-06-04 DIAGNOSIS — Z87891 Personal history of nicotine dependence: Secondary | ICD-10-CM | POA: Diagnosis not present

## 2021-06-04 DIAGNOSIS — W01198A Fall on same level from slipping, tripping and stumbling with subsequent striking against other object, initial encounter: Secondary | ICD-10-CM | POA: Insufficient documentation

## 2021-06-04 DIAGNOSIS — S50311A Abrasion of right elbow, initial encounter: Secondary | ICD-10-CM | POA: Diagnosis not present

## 2021-06-04 DIAGNOSIS — S0001XA Abrasion of scalp, initial encounter: Secondary | ICD-10-CM | POA: Diagnosis not present

## 2021-06-04 DIAGNOSIS — T1490XA Injury, unspecified, initial encounter: Secondary | ICD-10-CM

## 2021-06-04 DIAGNOSIS — E119 Type 2 diabetes mellitus without complications: Secondary | ICD-10-CM | POA: Diagnosis not present

## 2021-06-04 DIAGNOSIS — S1191XA Laceration without foreign body of unspecified part of neck, initial encounter: Secondary | ICD-10-CM | POA: Diagnosis not present

## 2021-06-04 DIAGNOSIS — S0101XA Laceration without foreign body of scalp, initial encounter: Secondary | ICD-10-CM | POA: Diagnosis not present

## 2021-06-04 DIAGNOSIS — W19XXXA Unspecified fall, initial encounter: Secondary | ICD-10-CM

## 2021-06-04 LAB — CBC WITH DIFFERENTIAL/PLATELET
Abs Immature Granulocytes: 0.06 10*3/uL (ref 0.00–0.07)
Basophils Absolute: 0 10*3/uL (ref 0.0–0.1)
Basophils Relative: 0 %
Eosinophils Absolute: 0.1 10*3/uL (ref 0.0–0.5)
Eosinophils Relative: 1 %
HCT: 41.5 % (ref 36.0–46.0)
Hemoglobin: 13.6 g/dL (ref 12.0–15.0)
Immature Granulocytes: 1 %
Lymphocytes Relative: 23 %
Lymphs Abs: 2.5 10*3/uL (ref 0.7–4.0)
MCH: 28 pg (ref 26.0–34.0)
MCHC: 32.8 g/dL (ref 30.0–36.0)
MCV: 85.6 fL (ref 80.0–100.0)
Monocytes Absolute: 0.9 10*3/uL (ref 0.1–1.0)
Monocytes Relative: 8 %
Neutro Abs: 7.5 10*3/uL (ref 1.7–7.7)
Neutrophils Relative %: 67 %
Platelets: 251 10*3/uL (ref 150–400)
RBC: 4.85 MIL/uL (ref 3.87–5.11)
RDW: 15.5 % (ref 11.5–15.5)
WBC: 11 10*3/uL — ABNORMAL HIGH (ref 4.0–10.5)
nRBC: 0 % (ref 0.0–0.2)

## 2021-06-04 LAB — BASIC METABOLIC PANEL
Anion gap: 14 (ref 5–15)
BUN: 19 mg/dL (ref 8–23)
CO2: 28 mmol/L (ref 22–32)
Calcium: 9.6 mg/dL (ref 8.9–10.3)
Chloride: 99 mmol/L (ref 98–111)
Creatinine, Ser: 0.8 mg/dL (ref 0.44–1.00)
GFR, Estimated: >60 mL/min (ref >60)
Glucose, Bld: 122 mg/dL — ABNORMAL HIGH (ref 70–99)
Potassium: 4.5 mmol/L (ref 3.5–5.1)
Sodium: 141 mmol/L (ref 135–145)

## 2021-06-04 LAB — URINALYSIS, ROUTINE W REFLEX MICROSCOPIC
Bilirubin Urine: NEGATIVE
Glucose, UA: >=500 mg/dL — AB
Hgb urine dipstick: NEGATIVE
Ketones, ur: NEGATIVE mg/dL
Leukocytes,Ua: NEGATIVE
Nitrite: NEGATIVE
Protein, ur: NEGATIVE mg/dL
Specific Gravity, Urine: 1.015 (ref 1.005–1.030)
pH: 5.5 (ref 5.0–8.0)

## 2021-06-04 LAB — URINALYSIS, MICROSCOPIC (REFLEX): WBC, UA: NONE SEEN WBC/hpf (ref 0–5)

## 2021-06-04 LAB — PROTIME-INR
INR: 0.9 (ref 0.8–1.2)
Prothrombin Time: 12.3 seconds (ref 11.4–15.2)

## 2021-06-04 MED ORDER — HYDROMORPHONE HCL 1 MG/ML IJ SOLN
0.5000 mg | Freq: Once | INTRAMUSCULAR | Status: AC
  Administered 2021-06-04: 0.5 mg via INTRAVENOUS
  Filled 2021-06-04: qty 1

## 2021-06-04 MED ORDER — SENNOSIDES-DOCUSATE SODIUM 8.6-50 MG PO TABS: 1.0000 | ORAL_TABLET | Freq: Every evening | ORAL | 0 refills | Status: AC | PRN

## 2021-06-04 MED ORDER — IOHEXOL 300 MG/ML  SOLN
100.0000 mL | Freq: Once | INTRAMUSCULAR | Status: AC | PRN
  Administered 2021-06-04: 100 mL via INTRAVENOUS

## 2021-06-04 MED ORDER — SODIUM CHLORIDE 0.9 % IV SOLN: Freq: Once | INTRAVENOUS | Status: AC

## 2021-06-04 MED ORDER — OXYCODONE-ACETAMINOPHEN 5-325 MG PO TABS: 1.0000 | ORAL_TABLET | Freq: Four times a day (QID) | ORAL | 0 refills | Status: DC | PRN

## 2021-06-04 MED ORDER — DICLOFENAC SODIUM 1 % EX GEL: 2.0000 g | Freq: Four times a day (QID) | CUTANEOUS | 0 refills | Status: DC | PRN

## 2021-06-04 NOTE — ED Triage Notes (Addendum)
She fell 6 hours ago hitting the right side of her head on the car door. Laceration noted. Dried blood noted. C.o back pain. She was seen at [facility] and was told to come here for further evaluation. No loc. She does not take blood thinners.

## 2021-06-04 NOTE — ED Notes (Signed)
IS given to patient. Tolerated well.

## 2021-06-04 NOTE — ED Notes (Signed)
Patient transported to CT 

## 2021-06-04 NOTE — ED Notes (Signed)
Pt verbalized understanding of discharge paperwork. Denies any questions at this time. Prescriptions reviewed with patient and family member

## 2021-06-04 NOTE — ED Notes (Signed)
2 unsuccessful IV attempts. Successful blood draw however IV infiltrated.

## 2021-06-04 NOTE — ED Provider Notes (Signed)
[facility] EMERGENCY DEPARTMENT Provider Note   CSN: [id number] Arrival date & time: 06/04/21  1117     History Chief Complaint  Patient presents with   Ashley Dean Ashley Dean is a 80 y.o. female.  HPI Patient fell.  She was trying to get into her car and she ended up striking her head and the right side of her chest wall.  She denies getting knocked out.  She has some generalized headache.  She is not on blood thinners.  No neck pain.  No weakness or numbness to the extremities.  She reports the worst pain is in her side down along her thoracic back and flank area.  It hurts to move and hurts to take a deep breath.  He denies weakness or numbness to the legs.  She reports she was able to bear weight and does not have hip pain or low back pain.    Past Medical History:  Diagnosis Date   Arthritis    back   Complication of anesthesia    post-op urinary retention   Diverticulosis of colon    Fibromyalgia    History of adenomatous polyp of colon    09-20-2004   History of kidney stones    History of pelvic mass    12-04-2015  s/p  lap. 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   History of urinary retention    hx post-op retention   Hyperlipidemia    Hypertension    Left ureteral stone    Macular degeneration    Osteopenia    Type 2 diabetes mellitus ([place])    Wears dentures    upper   Wears glasses     There are no problems to display for this patient.   Past Surgical History:  Procedure Laterality Date   ANKLE SURGERY     CATARACT EXTRACTION W/ INTRAOCULAR LENS  IMPLANT, BILATERAL  1992   CYSTOSCOPY WITH URETEROSCOPY AND STENT PLACEMENT Left 05/19/2017   Procedure: CYSTOSCOPY WITH URETEROSCOPY/ STONE EXTRACTION;  Surgeon: Irine Seal, MD;  Location: [facility];  Service: Urology;  Laterality: Left;   EXTRACORPOREAL SHOCK WAVE LITHOTRIPSY Left last one 11/08/2015   previously x2   LAPAROSCOPIC  CHOLECYSTECTOMY  04-09-2005   dr toth   LAPAROSCOPY W/ BILATERAL SALPINGOOPHORECTOMY   12-04-2015   at [facility])   NEPHROLITHOTOMY  1978   ORIF RIGHT ANKLE FX  11/09/2008   TONSILLECTOMY  child   VAGINAL HYSTERECTOMY  1978     OB History   No obstetric history on file.     No family history on file.  Social History   Tobacco Use   Smoking status: Former    Years: 20.00    Types: Cigarettes    Quit date: 11/10/2002    Years since quitting: 18.5   Smokeless tobacco: Never  Vaping Use   Vaping Use: Never used  Substance Use Topics   Alcohol use: No   Drug use: No    Comment: no    Home Medications Prior to Admission medications   Medication Sig Start Date End Date Taking? Authorizing Provider  Empagliflozin (JARDIANCE PO) Take by mouth.   Yes [provider]  atenolol (TENORMIN) 50 MG tablet Take 50 mg by mouth every morning.     [provider]  beta carotene w/minerals (OCUVITE) tablet Take 1 tablet by mouth daily. Capsule form     [provider]  DULoxetine (  CYMBALTA) 30 MG capsule Take 30 mg by mouth at bedtime.     [provider]  lisinopril-hydrochlorothiazide (PRINZIDE,ZESTORETIC) 20-25 MG tablet Take 1 tablet by mouth every morning.    [provider]  Melatonin 3-2 MG TABS Take 5 mg by mouth.    [provider]  metFORMIN (GLUCOPHAGE) 500 MG tablet Take 1,000 mg by mouth 2 (two) times daily with a meal.    [provider]  rosuvastatin (CRESTOR) 10 MG tablet Take 10 mg by mouth every morning.     [provider]  traMADol (ULTRAM) 50 MG tablet Take 50 mg by mouth every 6 (six) hours as needed.    [provider]    Allergies    Macrodantin [nitrofurantoin macrocrystal]  Review of Systems   Review of Systems 10 Systems reviewed and negative except as per HPI Physical Exam Updated Vital Signs BP 129/74 (BP Location: Left Arm)   Pulse 75   Temp 98.3 F (36.8 C) (Oral)    Resp 16   Ht '5\' 5"'$  (1.651 m)   Wt 70.3 kg   SpO2 98%   BMI 25.79 kg/m   Physical Exam Constitutional:      Comments: Alert nontoxic no respiratory distress clear mental status  HENT:     Head:     Comments: Less than 1 cm deep abrasion to the scalp.  No active bleeding.  No surrounding hematoma    Nose: Nose normal.     Mouth/Throat:     Mouth: Mucous membranes are moist.     Pharynx: Oropharynx is clear.  Eyes:     Extraocular Movements: Extraocular movements intact.     Pupils: Pupils are equal, round, and reactive to light.  Cardiovascular:     Rate and Rhythm: Normal rate and regular rhythm.  Pulmonary:     Effort: Pulmonary effort is normal.     Breath sounds: Normal breath sounds.     Comments: No crepitus.  Very painful to palpation lower thoracic chest wall on the right. Chest:     Chest wall: Tenderness present.  Abdominal:     Palpations: Abdomen is soft.     Comments: Right upper quadrant tender no guarding.  No distention  Musculoskeletal:        General: Signs of injury present. No swelling or deformity. Normal range of motion.     Cervical back: Neck supple.     Comments: Very minor abrasion to the point of the right elbow no effusion or decreased range of motion.  Mild edema at the ankles.  No deformity normal range of motion without pain  Skin:    General: Skin is warm and dry.  Neurological:     General: No focal deficit present.     Mental Status: She is oriented to person, place, and time.     Motor: No weakness.     Coordination: Coordination normal.  Psychiatric:        Mood and Affect: Mood normal.    ED Results / Procedures / Treatments   Labs (all labs ordered are listed, but only abnormal results are displayed) Labs Reviewed  CBC WITH DIFFERENTIAL/PLATELET - Abnormal; Notable for the following components:      Result Value   WBC 11.0 (*)    All other components within normal limits  CBC WITH DIFFERENTIAL/PLATELET  URINALYSIS, ROUTINE W  REFLEX MICROSCOPIC  BASIC METABOLIC PANEL    EKG None  Radiology No results found.  Procedures Procedures  Medications Ordered in ED Medications  0.9 %  sodium chloride infusion (0 mLs Intravenous Hold 06/04/21 1618)  HYDROmorphone (DILAUDID) injection 0.5 mg (0.5 mg Intravenous Given 06/04/21 1523)    ED Course  I have reviewed the triage vital signs and the nursing notes.  Pertinent labs & imaging results that were available during my care of the patient were reviewed by me and considered in my medical decision making (see chart for details).    MDM Rules/Calculators/A&P                           Patient mechanical fall.  She did strike her head.  Hematoma.  No loss of consciousness.  With patient's age and generalized headache will obtain CT scan.  Patient also has significant pain to the right thoracic chest wall and CVA area.  Breath sounds are present bilaterally.  Clinically no sign of pneumothorax.  We will proceed with CT of the chest and abdomen.  She will be treated for pain.  If diagnostic studies without remarkable findings, anticipate patient will be stable for discharge with pain control.  Dr. Laverta Baltimore to assume care for review of diagnostic results and clinical decision making. Final Clinical Impression(s) / ED Diagnoses Final diagnoses:  Trauma    Rx / DC Orders ED Discharge Orders     None        Charlesetta Shanks, MD 06/04/21 [phone]

## 2021-06-04 NOTE — Discharge Instructions (Addendum)
Your workup today showed that you have a fracture to one or more ribs.  Unfortunately this type of injury hurts but there is no way to fix it immediately; it must heal over time.  Be sure to take plenty of deep breaths so that you get rid of the "bad air" in your lungs.  If you are given a device called an incentive spirometer, please use it as recommended.  Unless you have been told by your doctor not to do so you can also take Tylenol 1000 mg every 6 hours for pain.  Follow-up at the clinics or with the doctors described in this paperwork.  Return to the emergency department if he develop new or worsening symptoms that concern you.

## 2021-06-04 NOTE — ED Provider Notes (Signed)
Blood pressure (!) 142/85, pulse 73, temperature 98.3 F (36.8 C), temperature source Oral, resp. rate 18, height '5\' 5"'$  (1.651 m), weight 70.3 kg, SpO2 98 %.  Assuming care from Dr. Johnney Killian.  In short, Ashley Dean is a 80 y.o. female with a chief complaint of Fall .  Refer to the original H&P for additional details.  The current plan of care is to follow up on CT imaging and reassess.  06:33 PM  Patient with multiple rib fractures on the right.  No pneumothorax or pulmonary contusion.  Discussed these with the patient and advised admission for pain management and pulmonary physical therapy.  She tells me that her husband is at home and needs her assistance.  She is adamant about returning home and not being admitted.  We discussed increased risk of pneumonia.  She tells me that she plans on going home and she will do the incentive spirometer and return if symptoms worsen.  Discussed strict ED return precautions.     Margette Fast, MD 06/04/21 [phone]

## 2021-06-10 DIAGNOSIS — E041 Nontoxic single thyroid nodule: Secondary | ICD-10-CM | POA: Diagnosis not present

## 2021-06-10 DIAGNOSIS — W19XXXD Unspecified fall, subsequent encounter: Secondary | ICD-10-CM | POA: Diagnosis not present

## 2021-06-10 DIAGNOSIS — S2241XD Multiple fractures of ribs, right side, subsequent encounter for fracture with routine healing: Secondary | ICD-10-CM | POA: Diagnosis not present

## 2021-06-11 ENCOUNTER — Other Ambulatory Visit: Payer: Self-pay | Admitting: Physician Assistant

## 2021-06-11 DIAGNOSIS — E041 Nontoxic single thyroid nodule: Secondary | ICD-10-CM

## 2021-07-09 ENCOUNTER — Ambulatory Visit
Admission: RE | Admit: 2021-07-09 | Discharge: 2021-07-09 | Disposition: A | Discharge status: Home / Self Care | Payer: Medicare Other | Source: Ambulatory Visit | Attending: Physician Assistant | Admitting: Physician Assistant

## 2021-07-09 ENCOUNTER — Other Ambulatory Visit: Payer: Self-pay

## 2021-07-09 DIAGNOSIS — E041 Nontoxic single thyroid nodule: Secondary | ICD-10-CM

## 2021-07-18 ENCOUNTER — Other Ambulatory Visit: Payer: Self-pay | Admitting: Physician Assistant

## 2021-07-18 DIAGNOSIS — E041 Nontoxic single thyroid nodule: Secondary | ICD-10-CM

## 2021-07-28 DIAGNOSIS — Z20828 Contact with and (suspected) exposure to other viral communicable diseases: Secondary | ICD-10-CM | POA: Diagnosis not present

## 2021-07-30 ENCOUNTER — Inpatient Hospital Stay: Admission: RE | Admit: 2021-07-30 | Payer: Medicare Other | Source: Ambulatory Visit

## 2021-08-28 ENCOUNTER — Other Ambulatory Visit (HOSPITAL_COMMUNITY)
Admission: RE | Admit: 2021-08-28 | Discharge: 2021-08-28 | Disposition: A | Discharge status: Home / Self Care | Payer: Medicare Other | Source: Ambulatory Visit | Attending: Physician Assistant | Admitting: Physician Assistant

## 2021-08-28 ENCOUNTER — Ambulatory Visit
Admission: RE | Admit: 2021-08-28 | Discharge: 2021-08-28 | Disposition: A | Discharge status: Home / Self Care | Payer: Medicare Other | Source: Ambulatory Visit | Attending: Physician Assistant | Admitting: Physician Assistant

## 2021-08-28 DIAGNOSIS — E041 Nontoxic single thyroid nodule: Secondary | ICD-10-CM

## 2021-08-28 DIAGNOSIS — E042 Nontoxic multinodular goiter: Secondary | ICD-10-CM | POA: Diagnosis not present

## 2021-08-29 LAB — CYTOLOGY - NON PAP

## 2021-09-12 DIAGNOSIS — I10 Essential (primary) hypertension: Secondary | ICD-10-CM | POA: Diagnosis not present

## 2021-09-12 DIAGNOSIS — Z0000 Encounter for general adult medical examination without abnormal findings: Secondary | ICD-10-CM | POA: Diagnosis not present

## 2021-09-12 DIAGNOSIS — F419 Anxiety disorder, unspecified: Secondary | ICD-10-CM | POA: Diagnosis not present

## 2021-09-12 DIAGNOSIS — M797 Fibromyalgia: Secondary | ICD-10-CM | POA: Diagnosis not present

## 2021-09-12 DIAGNOSIS — M48061 Spinal stenosis, lumbar region without neurogenic claudication: Secondary | ICD-10-CM | POA: Diagnosis not present

## 2021-09-12 DIAGNOSIS — Z1389 Encounter for screening for other disorder: Secondary | ICD-10-CM | POA: Diagnosis not present

## 2021-09-12 DIAGNOSIS — Z7984 Long term (current) use of oral hypoglycemic drugs: Secondary | ICD-10-CM | POA: Diagnosis not present

## 2021-09-12 DIAGNOSIS — Z23 Encounter for immunization: Secondary | ICD-10-CM | POA: Diagnosis not present

## 2021-09-12 DIAGNOSIS — E782 Mixed hyperlipidemia: Secondary | ICD-10-CM | POA: Diagnosis not present

## 2021-09-12 DIAGNOSIS — F324 Major depressive disorder, single episode, in partial remission: Secondary | ICD-10-CM | POA: Diagnosis not present

## 2021-09-12 DIAGNOSIS — E1169 Type 2 diabetes mellitus with other specified complication: Secondary | ICD-10-CM | POA: Diagnosis not present

## 2021-10-01 DIAGNOSIS — M545 Low back pain, unspecified: Secondary | ICD-10-CM | POA: Diagnosis not present

## 2021-12-28 DIAGNOSIS — M5416 Radiculopathy, lumbar region: Secondary | ICD-10-CM | POA: Diagnosis not present

## 2021-12-28 DIAGNOSIS — Z79899 Other long term (current) drug therapy: Secondary | ICD-10-CM | POA: Diagnosis not present

## 2021-12-28 DIAGNOSIS — Z5181 Encounter for therapeutic drug level monitoring: Secondary | ICD-10-CM | POA: Diagnosis not present

## 2021-12-28 DIAGNOSIS — M5136 Other intervertebral disc degeneration, lumbar region: Secondary | ICD-10-CM | POA: Diagnosis not present

## 2022-01-16 DIAGNOSIS — M5416 Radiculopathy, lumbar region: Secondary | ICD-10-CM | POA: Diagnosis not present

## 2022-02-07 DIAGNOSIS — S0990XA Unspecified injury of head, initial encounter: Secondary | ICD-10-CM | POA: Diagnosis not present

## 2022-02-08 DIAGNOSIS — M5416 Radiculopathy, lumbar region: Secondary | ICD-10-CM | POA: Diagnosis not present

## 2022-02-08 DIAGNOSIS — M47896 Other spondylosis, lumbar region: Secondary | ICD-10-CM | POA: Diagnosis not present

## 2022-02-08 DIAGNOSIS — M5136 Other intervertebral disc degeneration, lumbar region: Secondary | ICD-10-CM | POA: Diagnosis not present

## 2022-02-10 ENCOUNTER — Other Ambulatory Visit: Payer: Self-pay | Admitting: Family Medicine

## 2022-02-10 DIAGNOSIS — S0990XA Unspecified injury of head, initial encounter: Secondary | ICD-10-CM

## 2022-02-19 DIAGNOSIS — M25511 Pain in right shoulder: Secondary | ICD-10-CM | POA: Diagnosis not present

## 2022-02-25 ENCOUNTER — Ambulatory Visit
Admission: RE | Admit: 2022-02-25 | Discharge: 2022-02-25 | Disposition: A | Discharge status: Home / Self Care | Payer: Medicare Other | Source: Ambulatory Visit | Attending: Family Medicine | Admitting: Family Medicine

## 2022-02-25 ENCOUNTER — Other Ambulatory Visit: Payer: Self-pay | Admitting: Family Medicine

## 2022-02-25 DIAGNOSIS — S7001XA Contusion of right hip, initial encounter: Secondary | ICD-10-CM

## 2022-02-25 DIAGNOSIS — S0990XA Unspecified injury of head, initial encounter: Secondary | ICD-10-CM | POA: Diagnosis not present

## 2022-02-25 DIAGNOSIS — M797 Fibromyalgia: Secondary | ICD-10-CM | POA: Diagnosis not present

## 2022-02-25 DIAGNOSIS — M4186 Other forms of scoliosis, lumbar region: Secondary | ICD-10-CM | POA: Diagnosis not present

## 2022-02-25 DIAGNOSIS — R519 Headache, unspecified: Secondary | ICD-10-CM | POA: Diagnosis not present

## 2022-02-25 DIAGNOSIS — Z9181 History of falling: Secondary | ICD-10-CM | POA: Diagnosis not present

## 2022-03-05 ENCOUNTER — Other Ambulatory Visit: Payer: Medicare Other

## 2022-03-05 DIAGNOSIS — M25512 Pain in left shoulder: Secondary | ICD-10-CM | POA: Diagnosis not present

## 2022-03-17 DIAGNOSIS — E782 Mixed hyperlipidemia: Secondary | ICD-10-CM | POA: Diagnosis not present

## 2022-03-17 DIAGNOSIS — N183 Chronic kidney disease, stage 3 unspecified: Secondary | ICD-10-CM | POA: Diagnosis not present

## 2022-03-17 DIAGNOSIS — E1121 Type 2 diabetes mellitus with diabetic nephropathy: Secondary | ICD-10-CM | POA: Diagnosis not present

## 2022-03-17 DIAGNOSIS — M797 Fibromyalgia: Secondary | ICD-10-CM | POA: Diagnosis not present

## 2022-03-17 DIAGNOSIS — I10 Essential (primary) hypertension: Secondary | ICD-10-CM | POA: Diagnosis not present

## 2022-03-17 DIAGNOSIS — R209 Unspecified disturbances of skin sensation: Secondary | ICD-10-CM | POA: Diagnosis not present

## 2022-03-17 DIAGNOSIS — F419 Anxiety disorder, unspecified: Secondary | ICD-10-CM | POA: Diagnosis not present

## 2022-03-17 DIAGNOSIS — F324 Major depressive disorder, single episode, in partial remission: Secondary | ICD-10-CM | POA: Diagnosis not present

## 2022-03-18 DIAGNOSIS — I700 Atherosclerosis of aorta: Secondary | ICD-10-CM | POA: Diagnosis not present

## 2022-03-18 DIAGNOSIS — N130 Hydronephrosis with ureteropelvic junction obstruction: Secondary | ICD-10-CM | POA: Diagnosis not present

## 2022-03-18 DIAGNOSIS — N1330 Unspecified hydronephrosis: Secondary | ICD-10-CM | POA: Diagnosis not present

## 2022-03-18 DIAGNOSIS — M549 Dorsalgia, unspecified: Secondary | ICD-10-CM | POA: Diagnosis not present

## 2022-03-18 DIAGNOSIS — N200 Calculus of kidney: Secondary | ICD-10-CM | POA: Diagnosis not present

## 2022-03-18 DIAGNOSIS — Z9049 Acquired absence of other specified parts of digestive tract: Secondary | ICD-10-CM | POA: Diagnosis not present

## 2022-03-19 DIAGNOSIS — M25512 Pain in left shoulder: Secondary | ICD-10-CM | POA: Diagnosis not present

## 2022-03-20 ENCOUNTER — Other Ambulatory Visit (HOSPITAL_COMMUNITY): Payer: Self-pay | Admitting: Urology

## 2022-03-20 DIAGNOSIS — N130 Hydronephrosis with ureteropelvic junction obstruction: Secondary | ICD-10-CM

## 2022-03-27 ENCOUNTER — Other Ambulatory Visit: Payer: Self-pay | Admitting: Family Medicine

## 2022-03-27 DIAGNOSIS — R209 Unspecified disturbances of skin sensation: Secondary | ICD-10-CM

## 2022-04-02 ENCOUNTER — Ambulatory Visit (HOSPITAL_COMMUNITY)
Admission: RE | Admit: 2022-04-02 | Discharge: 2022-04-02 | Disposition: A | Discharge status: Home / Self Care | Payer: Medicare Other | Source: Ambulatory Visit | Attending: Urology | Admitting: Urology

## 2022-04-02 DIAGNOSIS — N19 Unspecified kidney failure: Secondary | ICD-10-CM | POA: Diagnosis not present

## 2022-04-02 DIAGNOSIS — N1330 Unspecified hydronephrosis: Secondary | ICD-10-CM | POA: Diagnosis not present

## 2022-04-02 DIAGNOSIS — N130 Hydronephrosis with ureteropelvic junction obstruction: Secondary | ICD-10-CM | POA: Diagnosis not present

## 2022-04-02 DIAGNOSIS — Z87442 Personal history of urinary calculi: Secondary | ICD-10-CM | POA: Diagnosis not present

## 2022-04-02 MED ORDER — FUROSEMIDE 10 MG/ML IJ SOLN
35.0000 mg | Freq: Once | INTRAMUSCULAR | Status: AC
  Administered 2022-04-02: 35 mg via INTRAVENOUS

## 2022-04-02 MED ORDER — TECHNETIUM TC 99M MERTIATIDE: 5.5000 | Freq: Once | INTRAVENOUS | Status: DC | PRN

## 2022-04-02 MED ORDER — FUROSEMIDE 10 MG/ML IJ SOLN
INTRAMUSCULAR | Status: AC
  Filled 2022-04-02: qty 4

## 2022-04-03 ENCOUNTER — Ambulatory Visit
Admission: RE | Admit: 2022-04-03 | Discharge: 2022-04-03 | Disposition: A | Discharge status: Home / Self Care | Payer: Medicare Other | Source: Ambulatory Visit | Attending: Family Medicine | Admitting: Family Medicine

## 2022-04-03 DIAGNOSIS — R209 Unspecified disturbances of skin sensation: Secondary | ICD-10-CM

## 2022-04-03 DIAGNOSIS — Z872 Personal history of diseases of the skin and subcutaneous tissue: Secondary | ICD-10-CM | POA: Diagnosis not present

## 2022-04-11 ENCOUNTER — Other Ambulatory Visit: Payer: Self-pay | Admitting: Urology

## 2022-04-11 ENCOUNTER — Encounter (HOSPITAL_BASED_OUTPATIENT_CLINIC_OR_DEPARTMENT_OTHER): Payer: Self-pay | Admitting: Urology

## 2022-04-11 ENCOUNTER — Other Ambulatory Visit: Payer: Self-pay

## 2022-04-11 DIAGNOSIS — N130 Hydronephrosis with ureteropelvic junction obstruction: Secondary | ICD-10-CM | POA: Diagnosis not present

## 2022-04-14 DIAGNOSIS — M25512 Pain in left shoulder: Secondary | ICD-10-CM | POA: Diagnosis not present

## 2022-04-16 ENCOUNTER — Encounter (HOSPITAL_BASED_OUTPATIENT_CLINIC_OR_DEPARTMENT_OTHER): Payer: Self-pay | Admitting: Urology

## 2022-04-16 ENCOUNTER — Ambulatory Visit (HOSPITAL_BASED_OUTPATIENT_CLINIC_OR_DEPARTMENT_OTHER)
Admission: RE | Admit: 2022-04-16 | Discharge: 2022-04-16 | Disposition: A | Discharge status: Home / Self Care | Payer: Medicare Other | Attending: Urology | Admitting: Urology

## 2022-04-16 ENCOUNTER — Ambulatory Visit (HOSPITAL_BASED_OUTPATIENT_CLINIC_OR_DEPARTMENT_OTHER): Payer: Medicare Other | Admitting: Anesthesiology

## 2022-04-16 ENCOUNTER — Encounter (HOSPITAL_BASED_OUTPATIENT_CLINIC_OR_DEPARTMENT_OTHER)
Admission: RE | Disposition: A | Discharge status: Home / Self Care | Payer: Self-pay | Source: Home / Self Care | Attending: Urology

## 2022-04-16 ENCOUNTER — Other Ambulatory Visit: Payer: Self-pay

## 2022-04-16 DIAGNOSIS — N135 Crossing vessel and stricture of ureter without hydronephrosis: Secondary | ICD-10-CM

## 2022-04-16 DIAGNOSIS — Z79899 Other long term (current) drug therapy: Secondary | ICD-10-CM | POA: Diagnosis not present

## 2022-04-16 DIAGNOSIS — E119 Type 2 diabetes mellitus without complications: Secondary | ICD-10-CM

## 2022-04-16 DIAGNOSIS — Z7984 Long term (current) use of oral hypoglycemic drugs: Secondary | ICD-10-CM | POA: Diagnosis not present

## 2022-04-16 DIAGNOSIS — M797 Fibromyalgia: Secondary | ICD-10-CM | POA: Insufficient documentation

## 2022-04-16 DIAGNOSIS — Z01818 Encounter for other preprocedural examination: Secondary | ICD-10-CM

## 2022-04-16 DIAGNOSIS — M419 Scoliosis, unspecified: Secondary | ICD-10-CM | POA: Diagnosis not present

## 2022-04-16 DIAGNOSIS — M1990 Unspecified osteoarthritis, unspecified site: Secondary | ICD-10-CM | POA: Diagnosis not present

## 2022-04-16 DIAGNOSIS — I10 Essential (primary) hypertension: Secondary | ICD-10-CM | POA: Diagnosis not present

## 2022-04-16 DIAGNOSIS — Z87891 Personal history of nicotine dependence: Secondary | ICD-10-CM | POA: Diagnosis not present

## 2022-04-16 DIAGNOSIS — N131 Hydronephrosis with ureteral stricture, not elsewhere classified: Secondary | ICD-10-CM | POA: Diagnosis not present

## 2022-04-16 DIAGNOSIS — N130 Hydronephrosis with ureteropelvic junction obstruction: Secondary | ICD-10-CM | POA: Diagnosis not present

## 2022-04-16 DIAGNOSIS — Z87442 Personal history of urinary calculi: Secondary | ICD-10-CM | POA: Diagnosis not present

## 2022-04-16 HISTORY — DX: Headache, unspecified: R51.9

## 2022-04-16 HISTORY — PX: CYSTOSCOPY W/ URETERAL STENT PLACEMENT: SHX1429

## 2022-04-16 LAB — POCT I-STAT, CHEM 8
BUN: 22 mg/dL (ref 8–23)
Calcium, Ion: 1.26 mmol/L (ref 1.15–1.40)
Chloride: 100 mmol/L (ref 98–111)
Creatinine, Ser: 0.7 mg/dL (ref 0.44–1.00)
Glucose, Bld: 121 mg/dL — ABNORMAL HIGH (ref 70–99)
HCT: 40 % (ref 36.0–46.0)
Hemoglobin: 13.6 g/dL (ref 12.0–15.0)
Potassium: 3.4 mmol/L — ABNORMAL LOW (ref 3.5–5.1)
Sodium: 141 mmol/L (ref 135–145)
TCO2: 26 mmol/L (ref 22–32)

## 2022-04-16 LAB — GLUCOSE, CAPILLARY: Glucose-Capillary: 138 mg/dL — ABNORMAL HIGH (ref 70–99)

## 2022-04-16 SURGERY — CYSTOSCOPY, WITH RETROGRADE PYELOGRAM AND URETERAL STENT INSERTION
Anesthesia: General | Site: Ureter | Laterality: Right

## 2022-04-16 MED ORDER — PROPOFOL 10 MG/ML IV BOLUS
INTRAVENOUS | Status: AC
  Filled 2022-04-16: qty 20

## 2022-04-16 MED ORDER — PHENAZOPYRIDINE HCL 200 MG PO TABS: 200.0000 mg | ORAL_TABLET | Freq: Three times a day (TID) | ORAL | 0 refills | Status: AC | PRN

## 2022-04-16 MED ORDER — SODIUM CHLORIDE 0.9 % IR SOLN
Status: DC | PRN
  Administered 2022-04-16: 3000 mL

## 2022-04-16 MED ORDER — 0.9 % SODIUM CHLORIDE (POUR BTL) OPTIME
TOPICAL | Status: DC | PRN
  Administered 2022-04-16: 500 mL

## 2022-04-16 MED ORDER — ACETAMINOPHEN 500 MG PO TABS
ORAL_TABLET | ORAL | Status: AC
  Filled 2022-04-16: qty 2

## 2022-04-16 MED ORDER — LIDOCAINE HCL (CARDIAC) PF 100 MG/5ML IV SOSY
PREFILLED_SYRINGE | INTRAVENOUS | Status: DC | PRN
  Administered 2022-04-16: 100 mg via INTRAVENOUS

## 2022-04-16 MED ORDER — FENTANYL CITRATE (PF) 100 MCG/2ML IJ SOLN: 25.0000 ug | INTRAMUSCULAR | Status: DC | PRN

## 2022-04-16 MED ORDER — ACETAMINOPHEN 500 MG PO TABS
1000.0000 mg | ORAL_TABLET | Freq: Once | ORAL | Status: AC
  Administered 2022-04-16: 1000 mg via ORAL

## 2022-04-16 MED ORDER — CELECOXIB 200 MG PO CAPS
ORAL_CAPSULE | ORAL | Status: AC
  Filled 2022-04-16: qty 1

## 2022-04-16 MED ORDER — ONDANSETRON HCL 4 MG/2ML IJ SOLN
INTRAMUSCULAR | Status: AC
  Filled 2022-04-16: qty 2

## 2022-04-16 MED ORDER — ONDANSETRON HCL 4 MG/2ML IJ SOLN
INTRAMUSCULAR | Status: DC | PRN
  Administered 2022-04-16: 4 mg via INTRAVENOUS

## 2022-04-16 MED ORDER — PROPOFOL 10 MG/ML IV BOLUS
INTRAVENOUS | Status: DC | PRN
  Administered 2022-04-16: 140 mg via INTRAVENOUS

## 2022-04-16 MED ORDER — DEXAMETHASONE SODIUM PHOSPHATE 10 MG/ML IJ SOLN
INTRAMUSCULAR | Status: AC
  Filled 2022-04-16: qty 1

## 2022-04-16 MED ORDER — CELECOXIB 200 MG PO CAPS
200.0000 mg | ORAL_CAPSULE | Freq: Once | ORAL | Status: AC
  Administered 2022-04-16: 200 mg via ORAL

## 2022-04-16 MED ORDER — FENTANYL CITRATE (PF) 100 MCG/2ML IJ SOLN
INTRAMUSCULAR | Status: DC | PRN
  Administered 2022-04-16 (×2): 25 ug via INTRAVENOUS
  Administered 2022-04-16: 50 ug via INTRAVENOUS

## 2022-04-16 MED ORDER — PROMETHAZINE HCL 25 MG/ML IJ SOLN: 6.2500 mg | INTRAMUSCULAR | Status: DC | PRN

## 2022-04-16 MED ORDER — OXYBUTYNIN CHLORIDE 5 MG PO TABS: 5.0000 mg | ORAL_TABLET | Freq: Three times a day (TID) | ORAL | 1 refills | Status: DC | PRN

## 2022-04-16 MED ORDER — DEXAMETHASONE SODIUM PHOSPHATE 4 MG/ML IJ SOLN
INTRAMUSCULAR | Status: DC | PRN
  Administered 2022-04-16: 5 mg via INTRAVENOUS

## 2022-04-16 MED ORDER — CEFAZOLIN SODIUM-DEXTROSE 2-4 GM/100ML-% IV SOLN
INTRAVENOUS | Status: AC
  Filled 2022-04-16: qty 100

## 2022-04-16 MED ORDER — FENTANYL CITRATE (PF) 100 MCG/2ML IJ SOLN
INTRAMUSCULAR | Status: AC
  Filled 2022-04-16: qty 2

## 2022-04-16 MED ORDER — AMISULPRIDE (ANTIEMETIC) 5 MG/2ML IV SOLN: 10.0000 mg | Freq: Once | INTRAVENOUS | Status: DC | PRN

## 2022-04-16 MED ORDER — LIDOCAINE HCL (PF) 2 % IJ SOLN
INTRAMUSCULAR | Status: AC
  Filled 2022-04-16: qty 5

## 2022-04-16 MED ORDER — CEFAZOLIN SODIUM-DEXTROSE 2-4 GM/100ML-% IV SOLN
2.0000 g | Freq: Once | INTRAVENOUS | Status: AC
  Administered 2022-04-16: 2 g via INTRAVENOUS

## 2022-04-16 MED ORDER — SODIUM CHLORIDE 0.9 % IV SOLN
INTRAVENOUS | Status: DC
Start: 2022-04-16 — End: 2022-04-16

## 2022-04-16 MED ORDER — CEPHALEXIN 500 MG PO CAPS: 500.0000 mg | ORAL_CAPSULE | Freq: Two times a day (BID) | ORAL | 0 refills | Status: AC

## 2022-04-16 MED ORDER — IOHEXOL 300 MG/ML  SOLN
INTRAMUSCULAR | Status: DC | PRN
  Administered 2022-04-16: 5 mL

## 2022-04-16 SURGICAL SUPPLY — 25 items
APL SKNCLS STERI-STRIP NONHPOA (GAUZE/BANDAGES/DRESSINGS)
BAG DRAIN URO-CYSTO SKYTR STRL (DRAIN) ×2 IMPLANT
BAG DRN UROCATH (DRAIN) ×1
BASKET STONE 1.7 NGAGE (UROLOGICAL SUPPLIES) IMPLANT
BASKET ZERO TIP NITINOL 2.4FR (BASKET) ×2 IMPLANT
BENZOIN TINCTURE PRP APPL 2/3 (GAUZE/BANDAGES/DRESSINGS) IMPLANT
BSKT STON RTRVL ZERO TP 2.4FR (BASKET) ×1
CATH URET 5FR 28IN OPEN ENDED (CATHETERS) IMPLANT
CLOTH BEACON ORANGE TIMEOUT ST (SAFETY) ×2 IMPLANT
FIBER LASER FLEXIVA 365 (UROLOGICAL SUPPLIES) IMPLANT
GLOVE BIO SURGEON STRL SZ7.5 (GLOVE) ×2 IMPLANT
GOWN STRL REUS W/TWL XL LVL3 (GOWN DISPOSABLE) ×2 IMPLANT
GUIDEWIRE STR DUAL SENSOR (WIRE) IMPLANT
GUIDEWIRE ZIPWRE .038 STRAIGHT (WIRE) ×2 IMPLANT
IV NS IRRIG 3000ML ARTHROMATIC (IV SOLUTION) ×4 IMPLANT
KIT TURNOVER CYSTO (KITS) ×2 IMPLANT
MANIFOLD NEPTUNE II (INSTRUMENTS) ×2 IMPLANT
NS IRRIG 500ML POUR BTL (IV SOLUTION) ×2 IMPLANT
PACK CYSTO (CUSTOM PROCEDURE TRAY) ×2 IMPLANT
STRIP CLOSURE SKIN 1/2X4 (GAUZE/BANDAGES/DRESSINGS) IMPLANT
SYR 10ML LL (SYRINGE) ×2 IMPLANT
TRACTIP FLEXIVA PULS ID 200XHI (Laser) IMPLANT
TRACTIP FLEXIVA PULSE ID 200 (Laser)
TUBE CONNECTING 12X1/4 (SUCTIONS) IMPLANT
TUBING UROLOGY SET (TUBING) ×2 IMPLANT

## 2022-04-16 NOTE — Anesthesia Preprocedure Evaluation (Addendum)
Anesthesia Evaluation  Patient identified by MRN, date of birth, ID band Patient awake    Reviewed: Allergy & Precautions, NPO status , Patient's Chart, lab work & pertinent test results  History of Anesthesia Complications Negative for: history of anesthetic complications  Airway Mallampati: I  TM Distance: >3 FB Neck ROM: Full    Dental  (+) Edentulous Upper, Edentulous Lower, Dental Advisory Given   Pulmonary former smoker,    Pulmonary exam normal        Cardiovascular hypertension, Pt. on medications and Pt. on home beta blockers Normal cardiovascular exam     Neuro/Psych    GI/Hepatic   Endo/Other  diabetes  Renal/GU      Musculoskeletal  (+) Arthritis , Fibromyalgia -  Abdominal   Peds  Hematology   Anesthesia Other Findings   Reproductive/Obstetrics                            Anesthesia Physical  Anesthesia Plan  ASA: 3  Anesthesia Plan: General   Post-op Pain Management: Celebrex PO (pre-op)* and Tylenol PO (pre-op)*   Induction: Intravenous  PONV Risk Score and Plan: 4 or greater and Ondansetron, Dexamethasone, Propofol, Treatment may vary due to age or medical condition and Diphenhydramine  Airway Management Planned: LMA  Additional Equipment:   Intra-op Plan:   Post-operative Plan: Extubation in OR  Informed Consent: I have reviewed the patients History and Physical, chart, labs and discussed the procedure including the risks, benefits and alternatives for the proposed anesthesia with the patient or authorized representative who has indicated his/her understanding and acceptance.     Dental advisory given  Plan Discussed with: CRNA and Anesthesiologist  Anesthesia Plan Comments:        Anesthesia Quick Evaluation

## 2022-04-16 NOTE — Op Note (Signed)
Operative Note  Preoperative diagnosis:  1.  Right UPJ obstruction  Postoperative diagnosis: 1.  Right UPJ obstruction  Procedure(s): 1.  Cystoscopy with right ureteroscopy and right ureteral stent placement. 2.  Right retrograde pyelogram with intraoperative interpretation fluoroscopic imaging  Surgeon: Ellison Hughs, MD  Assistants:  None  Anesthesia:  General  Complications:  None  EBL: Less than 5 mL  Specimens: 1.  None  Drains/Catheters: 1.  Right 6 French, 24 cm JJ stent with tether  Intraoperative findings:   Right retrograde pyelogram revealed narrowing at the right UPJ with mild to moderate dilation of the right renal pelvis and its associated calyces.  No internal filling defects were identified within the renal pelvis or the remaining length of the right ureter. No intravesical abnormalities were seen  Indication:  Ashley Dean is a 81 y.o. 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  She is here today for a diagnostic ureteroscopy to assess for an intrinsic source of her obstruction.  She has been consented for the above procedures, voices understanding and wishes to proceed.  Description of procedure:  After informed consent was obtained, the patient was brought to the operating room and general LMA anesthesia was administered. The patient was then placed in the dorsolithotomy position and prepped and draped in the usual sterile fashion. A timeout was performed. A 23 French rigid cystoscope was then inserted into the urethral meatus and advanced into the bladder under direct vision. A complete bladder survey revealed no intravesical pathology.  A 5 French ureteral catheter was then inserted into the right ureteral orifice and a retrograde pyelogram was obtained, with the findings listed above.  A Glidewire was then used to intubate the lumen of the ureteral catheter and was advanced up to the right renal pelvis, under fluoroscopic guidance.   The catheter was then removed, leaving the wire in place.  A flexible ureteroscope was then advanced over the wire and into the proximal aspects of the right ureter.  A full inspection of the right UPJ revealed no evidence of intrinsic obstruction or signs of ureteral stricture disease.  The flexible ureteroscope was advanced into the right renal pelvis.  Full inspection of the renal pelvis revealed no evidence of any mucosal abnormalities or stone burden.  The ureteroscope was then retracted along the full length of the right ureter, again, identifying no ureteral abnormalities.  The flexible ureteroscope was then removed, leaving the wire in place.  A 6 French, 24 cm JJ stent with a tether was then advanced over the wire and into good position within the right collecting system, confirming placement via fluoroscopy.  Patient's bladder was drained.  She tolerated the procedure well and was transferred to the postanesthesia in stable condition.  Plan: She has been instructed to remove her stent at 7 AM on 04/19/2022.  She has a CT pending to evaluate for a crossing vessel as a source of her right UPJ obstruction.

## 2022-04-16 NOTE — H&P (Signed)
PRE_OP H&P  Office Visit Report     04/11/2022   --------------------------------------------------------------------------------   Ashley Dean  MRN: [id number]  DOB: 1941/05/10, 81 year old Female  PRIMARY CARE:  Judithann Sauger, MD  REFERRING:  Irine Seal, MD  PROVIDER:  Irine Seal, M.D.  TREATING:  Ellison Hughs, M.D.  LOCATION:  [facility] [phone]     --------------------------------------------------------------------------------   CC/HPI: Right UPJO   Ashley Dean is a 82 year old female, referred by Dr. 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 She has a long history of kidney stones. Recent kidney stone interventions, include left ureteroscopy and left ESWL. She denies any prior surgical intervention involving her right kidney. Review of her CT scan from April 2022 revealed an an extrarenal pelvis on the right versus very mild hydronephrosis. Recent renal ultrasound and CT scan showed a significant exacerbation in her right-sided hydronephrosis. Lasix renogram from 04/02/2022 revealed delayed excretion from the right kidney with a differential function of 53% on the right and 47% on the left. The right collecting system was noted to be severely dilated with failure to clear the radiotracer over the course of the study, consistent with urinary outflow obstruction.   Today, she denies any significant flank pain, especially on the right. 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 She states that she is voiding without difficulty and denies any recent episodes of dysuria, hematuria or UTIs.     ALLERGIES: Macrodantin CAPS    MEDICATIONS: Metformin Hcl 500 mg tablet  Percocet  Atenolol 50 MG Oral Tablet Oral  Crestor 10 MG Oral Tablet Oral  Cymbalta 20  mg capsule,delayed release Oral  Jardiance  Lisinopril-Hydrochlorothiazide 20-25 MG Oral Tablet Oral  Ocuvite With Lutein 300 mcg-200 mg-27 mg-40 mg-55 mcg-2 mg-2 mg tablet Oral  Rybelsus     GU PSH: ESWL - 2017, 2011 Hysterectomy Unilat SO - 2016 Locm 300-'399Mg'$ /Ml Iodine,1Ml - 2018 Remove Kidney Stone - 2011 Ureteroscopic stone removal, Left - 2018       PSH Notes: Renal Lithotripsy, Hysterectomy, Lithotomy, Foot Surgery, Cholecystectomy, Lithotripsy   NON-GU PSH: Cholecystectomy (open) - 2011     GU PMH: Hydronephrosis, She has marked right hydro that is most consistent with a UPJ obstruction. I am going to get a lasix renogram and if obstruction is confirmed, I will consider cystoscopy with retrograde and stent or referral for consideration of a pyeloplasty. She will return with the results. - 03/18/2022 Renal calculus, There is a tiny calcification that is at the upper pole of the right kidney but it may be vascular. No other stones seen. - 03/18/2022, - 02/19/2021, She has bilateral renal stones but they are small and don't appear obstructing. I will have her return in 1 year with a CT stone study. , - 2021, - 2020, Kidney stone on left side, - 2017 Urge incontinence, He has some UUI if she is not careful but it is not severe. - 2019 Microscopic hematuria - 2018 Ureteral calculus, She has a possible LUVJ stone but I am not sure that is what is causing her pain. She does have some hematuria. I am going to give her rapaflo since she has a sulfa allergy. She will return in about a months with a KUB. - 2018 LUQ pain, Abdominal pain, LUQ (left upper quadrant) -  2017 RUQ pain, Abdominal pain, RUQ (right upper quadrant) - 2016 History of urolithiasis, Nephrolithiasis - 2014      PMH Notes:  2010-01-09 12:30:05 - Note: Arthritis   NON-GU PMH: Encounter for general adult medical examination without abnormal findings, Encounter for preventive health examination - 2017 Personal history of  other diseases of the nervous system and sense organs, History of sleep apnea - 2016 Personal history of other diseases of the circulatory system, History of hypertension - 2014 Personal history of other endocrine, nutritional and metabolic disease, History of hypercholesterolemia - 2014, History of diabetes mellitus, - 2014 Personal history of other mental and behavioral disorders, History of depression - 2014    FAMILY HISTORY: Colon Cancer - Father Death In The Family Father - Father Death In The Family Mother - Father Family Health Status Number - Father   SOCIAL HISTORY: Marital Status: Married Preferred Language: English; Race: White     Notes: Retired, 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  Gastrointestinal (Upper):   Patient denies indigestion/ heartburn, vomiting, and nausea.  Gastrointestinal (Lower):   Patient denies diarrhea and constipation.  Constitutional:   Patient denies fever, night sweats, weight loss, and fatigue.  Skin:   Patient denies skin rash/ lesion and itching.  Eyes:   Patient denies blurred vision and double vision.  Ears/ Nose/ Throat:   Patient denies sore throat and sinus problems.  Hematologic/Lymphatic:   Patient denies swollen glands and easy bruising.  Cardiovascular:   Patient denies leg swelling and chest pains.  Respiratory:   Patient denies cough and shortness of breath.  Endocrine:   Patient denies excessive thirst.  Musculoskeletal:   Patient denies back pain and joint pain.  Neurological:   Patient denies headaches and dizziness.  Psychologic:   Patient denies depression and anxiety.   VITAL  SIGNS:      04/11/2022 10:29 AM  Weight 150 lb / 68.04 kg  Height 65 in / 165.1 cm  BP 119/67 mmHg  Pulse 1 /min  Temperature 96.4 F / 35.7 C  BMI 25.0 kg/m   MULTI-SYSTEM PHYSICAL EXAMINATION:    Constitutional: Well-nourished. No physical deformities. Normally developed. Good grooming.  Neurologic / Psychiatric: Oriented to time, oriented to place, oriented to person. No depression, no anxiety, no agitation.     Complexity of Data:  Records Review:   Previous Doctor Records, Previous Hospital Records  X-Ray Review: C.T. Abdomen/Pelvis: Reviewed Films. Reviewed Report. Discussed With Patient. CLINICAL DATA: Hydronephrosis, left back pain   EXAM:  CT ABDOMEN AND PELVIS WITHOUT CONTRAST   TECHNIQUE:  Multidetector CT imaging of the abdomen and pelvis was performed  following the standard protocol without IV contrast.   RADIATION DOSE REDUCTION: This exam was performed according to the  departmental dose-optimization program which includes automated  exposure control, adjustment of the mA and/or kV according to  patient size and/or use of iterative reconstruction technique.   COMPARISON: Renal ultrasound, 03/18/2022, CT chest abdomen pelvis,  06/04/2021   FINDINGS:  Lower chest: No acute abnormality. Coronary artery calcification.   Hepatobiliary: No focal liver abnormality is seen. Status post  cholecystectomy. No biliary dilatation.  Pancreas: Unremarkable. No pancreatic ductal dilatation or  surrounding inflammatory changes.   Spleen: Normal in size without significant abnormality.   Adrenals/Urinary Tract: Adrenal glands are unremarkable. Severe  right hydronephrosis with abrupt caliber change at the ureteropelvic  junction. Small renal vascular calcification near the superior pole  of the right kidney without evidence of urinary tract calculi.  Bladder is unremarkable.   Stomach/Bowel: Stomach is within normal limits. Appendix appears  normal. No evidence of  bowel wall thickening, distention, or  inflammatory changes.   Vascular/Lymphatic: Aortic atherosclerosis. No enlarged abdominal or  pelvic lymph nodes.   Reproductive: Status post cholecystectomy.   Other: No abdominal wall hernia or abnormality. No ascites.   Musculoskeletal: No acute or significant osseous findings.   IMPRESSION:  1. Severe right hydronephrosis with abrupt caliber change at the  ureteropelvic junction, suggesting ureteropelvic junction  obstruction. No visible urinary tract calculi.  2. Status post cholecystectomy and hysterectomy.  3. Coronary artery disease.   These results will be called to the ordering clinician or  representative by the Radiologist Assistant, and communication  documented in the PACS or [employer].   Aortic Atherosclerosis (ICD10-I70.0).    Electronically Signed  By: Delanna Ahmadi M.D.  On: 03/20/2022 09:11 Lasix Renogram: Reviewed Films. Reviewed Report. Discussed With Patient. CLINICAL DATA: Acquired hydronephrosis with UPJ obstruction,  history of kidney stones   EXAM:  NUCLEAR MEDICINE RENAL SCAN WITH DIURETIC ADMINISTRATION   TECHNIQUE:  Radionuclide angiographic and sequential renal images were obtained  after intravenous injection of radiopharmaceutical. Imaging was  continued during slow intravenous injection of Lasix approximately  15 minutes after the start of the examination.   RADIOPHARMACEUTICALS: 5.5 mCi Technetium-64mMAG3 IV   Pharmaceutical: Lasix 35 mg IV   COMPARISON: CT abdomen and pelvis 03/18/2022   FINDINGS:  Flow: Prompt symmetric arterial flow to the kidneys.   Left renogram: Normal uptake, concentration and excretion of tracer  by LEFT kidney. Good clearance of tracer before and continuing  following Lasix administration. No abnormal retained tracer at  conclusion of exam. Analysis of the renogram curve demonstrates peak  activity at 11.2 minutes with fall to half maximum activity 10.8   minutes later.   Right renogram: Normal uptake and concentration of tracer by RIGHT  kidney. Initial photopenic region centrally from collecting system  dilatation. Tracer is excreted into the dilated collecting system  with poor clearance during the first after the exam. Following Lasix  administration, improved clearance of tracer from the RIGHT kidney  is visualized. Retained tracer is seen within the dilated collecting  system at the conclusion of the exam. Analysis of the renogram curve  demonstrates peak activity at 17.2 minutes with failure to fall to  half maximum activity over the course of the study.   Differential:   Left kidney = 47 %   Right kidney = 53 %   T1/2 post Lasix :   Left kidney = 3.5 min   Right kidney = N/A min   IMPRESSION:  Normal LEFT renogram.   Dilated RIGHT renal collecting system with failure to fall to half  maximum activity over the course of the study; findings are  consistent with urinary outflow obstruction RIGHT kidney.    Electronically Signed  By: MLavonia DanaM.D.  On: 04/02/2022 16:36    PROCEDURES:          Urinalysis w/Scope Dipstick Dipstick Cont'd Micro  Color: Yellow Bilirubin: Neg mg/dL WBC/hpf: 6 - 10/hpf  Appearance: Clear Ketones: Neg  mg/dL RBC/hpf: 0 - 2/hpf  Specific Gravity: 1.020 Blood: Neg ery/uL Bacteria: Few (10-25/hpf)  pH: <=5.0 Protein: Neg mg/dL Cystals: NS (Not Seen)  Glucose: 3+ mg/dL Urobilinogen: 0.2 mg/dL Casts: NS (Not Seen)    Nitrites: Neg Trichomonas: Not Present    Leukocyte Esterase: 3+ leu/uL Mucous: Not Present      Epithelial Cells: 0 - 5/hpf      Yeast: NS (Not Seen)      Sperm: Not Present    Notes: qns to spin    ASSESSMENT:      ICD-10 Details  1 GU:   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 Hematuria With and Without I.V. Contrast - Right UPJ obstruction. Rule out crossing vessel   Return  Visit/Planned Activity: Next Available Appointment - Schedule Surgery          Document Letter(s):  Created for Judithann Sauger, MD   Created for Patient: Clinical Summary         Notes:    -Recent imaging reviewed and discussed with the patient. It is difficult to delineate whether or not she has a lower pole crossing vessel causing the exacerbation in her right-sided hydronephrosis. Her hydronephrosis could also be compounded by her scoliosis. I would like to proceed with a cystoscopy with right ureteroscopy to rule out any intrinsic source of her right-sided UPJ obstruction, especially given her history of kidney stones. She is minimally symptomatic today so I do not feel this needs to be done urgently. Plan for a BMP and CT urogram to rule out a crossing vessel as the source of her obstruction.   The risk, benefits and alternatives of cystoscopy with right ureteroscopy, possible endopyelotomy and possible right ureteral stent placement was discussed with the patient. Risk include, but are not limited to, bleeding complications, urinary tract infection, worsening ureteral stricture, ureteral stricture recurrence, worsening renal function, MI, CVA, DVT and the inherent risk of general anesthesia. She voices understanding and wishes to proceed.

## 2022-04-16 NOTE — Transfer of Care (Signed)
Immediate Anesthesia Transfer of Care Note  Patient: Lemmie Evens Schunk  Procedure(s) Performed: Procedure(s) (LRB): CYSTOSCOPY WITH RIGHT RETROGRADE PYELOGRAM/ right ureteroscopy/  URETERAL STENT PLACEMENT (Right)  Patient Location: PACU  Anesthesia Type: General  Level of Consciousness: awake, sedated, patient cooperative and responds to stimulation  Airway & Oxygen Therapy: Patient Spontanous Breathing and Patient connected to [place] 02   Post-op Assessment: Report given to PACU RN, Post -op Vital signs reviewed and stable and Patient moving all extremities  Post vital signs: Reviewed and stable  Complications: No apparent anesthesia complications

## 2022-04-16 NOTE — Anesthesia Procedure Notes (Addendum)
Procedure Name: LMA Insertion Date/Time: 04/16/2022 11:31 AM Performed by: Justice Rocher, CRNA Pre-anesthesia Checklist: Patient identified, Emergency Drugs available, Suction available, Patient being monitored and Timeout performed Patient Re-evaluated:Patient Re-evaluated prior to induction Oxygen Delivery Method: Circle system utilized Preoxygenation: Pre-oxygenation with 100% oxygen Induction Type: IV induction Ventilation: Mask ventilation without difficulty LMA: LMA inserted LMA Size: 4.0 Number of attempts: 1 Airway Equipment and Method: Bite block Placement Confirmation: positive ETCO2, breath sounds checked- equal and bilateral and CO2 detector Tube secured with: Tape Dental Injury: Teeth and Oropharynx as per pre-operative assessment

## 2022-04-16 NOTE — Anesthesia Postprocedure Evaluation (Signed)
Anesthesia Post Note  Patient: Ashley Dean  Procedure(s) Performed: CYSTOSCOPY WITH RIGHT RETROGRADE PYELOGRAM/ right ureteroscopy/  URETERAL STENT PLACEMENT (Right: Ureter)     Patient location during evaluation: PACU Anesthesia Type: General Level of consciousness: sedated Pain management: pain level controlled Vital Signs Assessment: post-procedure vital signs reviewed and stable Respiratory status: spontaneous breathing and respiratory function stable Cardiovascular status: stable Postop Assessment: no apparent nausea or vomiting Anesthetic complications: no   No notable events documented.  Last Vitals:  Vitals:   04/16/22 1245 04/16/22 1300  BP: 134/66 131/66  Pulse: 62 60  Resp: 16 16  Temp:  36.5 C  SpO2: 94% 95%    Last Pain:  Vitals:   04/16/22 1300  TempSrc:   PainSc: 0-No pain                 Culver Feighner DANIEL

## 2022-04-16 NOTE — Discharge Instructions (Signed)
No Tylenol until 4:00pm

## 2022-04-17 ENCOUNTER — Encounter (HOSPITAL_BASED_OUTPATIENT_CLINIC_OR_DEPARTMENT_OTHER): Payer: Self-pay | Admitting: Urology

## 2022-04-22 DIAGNOSIS — N134 Hydroureter: Secondary | ICD-10-CM | POA: Diagnosis not present

## 2022-04-22 DIAGNOSIS — N281 Cyst of kidney, acquired: Secondary | ICD-10-CM | POA: Diagnosis not present

## 2022-04-22 DIAGNOSIS — N1339 Other hydronephrosis: Secondary | ICD-10-CM | POA: Diagnosis not present

## 2022-04-22 DIAGNOSIS — K5730 Diverticulosis of large intestine without perforation or abscess without bleeding: Secondary | ICD-10-CM | POA: Diagnosis not present

## 2022-06-07 DIAGNOSIS — R21 Rash and other nonspecific skin eruption: Secondary | ICD-10-CM | POA: Diagnosis not present

## 2022-06-07 DIAGNOSIS — L255 Unspecified contact dermatitis due to plants, except food: Secondary | ICD-10-CM | POA: Diagnosis not present

## 2022-06-10 DIAGNOSIS — M4726 Other spondylosis with radiculopathy, lumbar region: Secondary | ICD-10-CM | POA: Diagnosis not present

## 2022-06-10 DIAGNOSIS — Z5181 Encounter for therapeutic drug level monitoring: Secondary | ICD-10-CM | POA: Diagnosis not present

## 2022-06-13 DIAGNOSIS — N130 Hydronephrosis with ureteropelvic junction obstruction: Secondary | ICD-10-CM | POA: Diagnosis not present

## 2022-07-04 DIAGNOSIS — M25571 Pain in right ankle and joints of right foot: Secondary | ICD-10-CM | POA: Diagnosis not present

## 2022-07-18 DIAGNOSIS — M79674 Pain in right toe(s): Secondary | ICD-10-CM | POA: Diagnosis not present

## 2022-08-08 DIAGNOSIS — M79674 Pain in right toe(s): Secondary | ICD-10-CM | POA: Diagnosis not present

## 2022-08-08 DIAGNOSIS — M25511 Pain in right shoulder: Secondary | ICD-10-CM | POA: Diagnosis not present

## 2022-09-17 DIAGNOSIS — E782 Mixed hyperlipidemia: Secondary | ICD-10-CM | POA: Diagnosis not present

## 2022-09-17 DIAGNOSIS — N183 Chronic kidney disease, stage 3 unspecified: Secondary | ICD-10-CM | POA: Diagnosis not present

## 2022-09-17 DIAGNOSIS — E1169 Type 2 diabetes mellitus with other specified complication: Secondary | ICD-10-CM | POA: Diagnosis not present

## 2022-09-17 DIAGNOSIS — Z0000 Encounter for general adult medical examination without abnormal findings: Secondary | ICD-10-CM | POA: Diagnosis not present

## 2022-09-17 DIAGNOSIS — I10 Essential (primary) hypertension: Secondary | ICD-10-CM | POA: Diagnosis not present

## 2022-09-17 DIAGNOSIS — F419 Anxiety disorder, unspecified: Secondary | ICD-10-CM | POA: Diagnosis not present

## 2022-09-17 DIAGNOSIS — Z1331 Encounter for screening for depression: Secondary | ICD-10-CM | POA: Diagnosis not present

## 2022-09-17 DIAGNOSIS — Z23 Encounter for immunization: Secondary | ICD-10-CM | POA: Diagnosis not present

## 2022-11-13 DIAGNOSIS — M545 Low back pain, unspecified: Secondary | ICD-10-CM | POA: Diagnosis not present

## 2022-11-13 DIAGNOSIS — M5136 Other intervertebral disc degeneration, lumbar region: Secondary | ICD-10-CM | POA: Diagnosis not present

## 2022-11-13 DIAGNOSIS — M5416 Radiculopathy, lumbar region: Secondary | ICD-10-CM | POA: Diagnosis not present

## 2022-11-13 DIAGNOSIS — M4180 Other forms of scoliosis, site unspecified: Secondary | ICD-10-CM | POA: Diagnosis not present

## 2022-11-13 DIAGNOSIS — M47896 Other spondylosis, lumbar region: Secondary | ICD-10-CM | POA: Diagnosis not present

## 2022-12-18 DIAGNOSIS — N130 Hydronephrosis with ureteropelvic junction obstruction: Secondary | ICD-10-CM | POA: Diagnosis not present

## 2023-03-16 DIAGNOSIS — M791 Myalgia, unspecified site: Secondary | ICD-10-CM | POA: Diagnosis not present

## 2023-03-16 DIAGNOSIS — M47896 Other spondylosis, lumbar region: Secondary | ICD-10-CM | POA: Diagnosis not present

## 2023-03-16 DIAGNOSIS — M545 Low back pain, unspecified: Secondary | ICD-10-CM | POA: Diagnosis not present

## 2023-03-16 DIAGNOSIS — M5136 Other intervertebral disc degeneration, lumbar region: Secondary | ICD-10-CM | POA: Diagnosis not present

## 2023-03-16 DIAGNOSIS — M5416 Radiculopathy, lumbar region: Secondary | ICD-10-CM | POA: Diagnosis not present

## 2023-03-16 DIAGNOSIS — Z79899 Other long term (current) drug therapy: Secondary | ICD-10-CM | POA: Diagnosis not present

## 2023-03-16 DIAGNOSIS — Z5181 Encounter for therapeutic drug level monitoring: Secondary | ICD-10-CM | POA: Diagnosis not present

## 2023-03-18 DIAGNOSIS — I10 Essential (primary) hypertension: Secondary | ICD-10-CM | POA: Diagnosis not present

## 2023-03-18 DIAGNOSIS — W57XXXA Bitten or stung by nonvenomous insect and other nonvenomous arthropods, initial encounter: Secondary | ICD-10-CM | POA: Diagnosis not present

## 2023-03-18 DIAGNOSIS — E782 Mixed hyperlipidemia: Secondary | ICD-10-CM | POA: Diagnosis not present

## 2023-03-18 DIAGNOSIS — L03319 Cellulitis of trunk, unspecified: Secondary | ICD-10-CM | POA: Diagnosis not present

## 2023-03-18 DIAGNOSIS — N183 Chronic kidney disease, stage 3 unspecified: Secondary | ICD-10-CM | POA: Diagnosis not present

## 2023-03-18 DIAGNOSIS — F419 Anxiety disorder, unspecified: Secondary | ICD-10-CM | POA: Diagnosis not present

## 2023-03-18 DIAGNOSIS — M797 Fibromyalgia: Secondary | ICD-10-CM | POA: Diagnosis not present

## 2023-03-18 DIAGNOSIS — M48061 Spinal stenosis, lumbar region without neurogenic claudication: Secondary | ICD-10-CM | POA: Diagnosis not present

## 2023-03-18 DIAGNOSIS — E1165 Type 2 diabetes mellitus with hyperglycemia: Secondary | ICD-10-CM | POA: Diagnosis not present

## 2023-05-05 DIAGNOSIS — H353132 Nonexudative age-related macular degeneration, bilateral, intermediate dry stage: Secondary | ICD-10-CM | POA: Diagnosis not present

## 2023-05-05 DIAGNOSIS — Z961 Presence of intraocular lens: Secondary | ICD-10-CM | POA: Diagnosis not present

## 2023-05-05 DIAGNOSIS — H35443 Age-related reticular degeneration of retina, bilateral: Secondary | ICD-10-CM | POA: Diagnosis not present

## 2023-05-05 DIAGNOSIS — E119 Type 2 diabetes mellitus without complications: Secondary | ICD-10-CM | POA: Diagnosis not present

## 2023-05-05 DIAGNOSIS — H524 Presbyopia: Secondary | ICD-10-CM | POA: Diagnosis not present

## 2023-06-11 DIAGNOSIS — N130 Hydronephrosis with ureteropelvic junction obstruction: Secondary | ICD-10-CM | POA: Diagnosis not present

## 2023-07-05 DIAGNOSIS — R21 Rash and other nonspecific skin eruption: Secondary | ICD-10-CM | POA: Diagnosis not present

## 2023-07-05 DIAGNOSIS — L237 Allergic contact dermatitis due to plants, except food: Secondary | ICD-10-CM | POA: Diagnosis not present

## 2023-07-05 DIAGNOSIS — L299 Pruritus, unspecified: Secondary | ICD-10-CM | POA: Diagnosis not present

## 2023-07-23 DIAGNOSIS — M791 Myalgia, unspecified site: Secondary | ICD-10-CM | POA: Diagnosis not present

## 2023-07-23 DIAGNOSIS — M47896 Other spondylosis, lumbar region: Secondary | ICD-10-CM | POA: Diagnosis not present

## 2023-07-23 DIAGNOSIS — M545 Low back pain, unspecified: Secondary | ICD-10-CM | POA: Diagnosis not present

## 2023-07-29 DIAGNOSIS — N130 Hydronephrosis with ureteropelvic junction obstruction: Secondary | ICD-10-CM | POA: Diagnosis not present

## 2023-07-29 DIAGNOSIS — N200 Calculus of kidney: Secondary | ICD-10-CM | POA: Diagnosis not present

## 2023-09-23 DIAGNOSIS — M48061 Spinal stenosis, lumbar region without neurogenic claudication: Secondary | ICD-10-CM | POA: Diagnosis not present

## 2023-09-23 DIAGNOSIS — I700 Atherosclerosis of aorta: Secondary | ICD-10-CM | POA: Diagnosis not present

## 2023-09-23 DIAGNOSIS — Z23 Encounter for immunization: Secondary | ICD-10-CM | POA: Diagnosis not present

## 2023-09-23 DIAGNOSIS — E782 Mixed hyperlipidemia: Secondary | ICD-10-CM | POA: Diagnosis not present

## 2023-09-23 DIAGNOSIS — Z0000 Encounter for general adult medical examination without abnormal findings: Secondary | ICD-10-CM | POA: Diagnosis not present

## 2023-09-23 DIAGNOSIS — F419 Anxiety disorder, unspecified: Secondary | ICD-10-CM | POA: Diagnosis not present

## 2023-09-23 DIAGNOSIS — I10 Essential (primary) hypertension: Secondary | ICD-10-CM | POA: Diagnosis not present

## 2023-09-23 DIAGNOSIS — Z1331 Encounter for screening for depression: Secondary | ICD-10-CM | POA: Diagnosis not present

## 2023-09-23 DIAGNOSIS — E119 Type 2 diabetes mellitus without complications: Secondary | ICD-10-CM | POA: Diagnosis not present

## 2023-10-01 DIAGNOSIS — E2839 Other primary ovarian failure: Secondary | ICD-10-CM | POA: Diagnosis not present

## 2023-10-01 DIAGNOSIS — M8588 Other specified disorders of bone density and structure, other site: Secondary | ICD-10-CM | POA: Diagnosis not present

## 2023-10-22 DIAGNOSIS — Z1231 Encounter for screening mammogram for malignant neoplasm of breast: Secondary | ICD-10-CM | POA: Diagnosis not present

## 2023-10-29 DIAGNOSIS — M810 Age-related osteoporosis without current pathological fracture: Secondary | ICD-10-CM | POA: Diagnosis not present

## 2023-11-23 DIAGNOSIS — M51362 Other intervertebral disc degeneration, lumbar region with discogenic back pain and lower extremity pain: Secondary | ICD-10-CM | POA: Diagnosis not present

## 2023-11-23 DIAGNOSIS — M47816 Spondylosis without myelopathy or radiculopathy, lumbar region: Secondary | ICD-10-CM | POA: Diagnosis not present

## 2023-11-23 DIAGNOSIS — M5416 Radiculopathy, lumbar region: Secondary | ICD-10-CM | POA: Diagnosis not present

## 2024-02-03 DIAGNOSIS — Z5181 Encounter for therapeutic drug level monitoring: Secondary | ICD-10-CM | POA: Diagnosis not present

## 2024-02-03 DIAGNOSIS — M51362 Other intervertebral disc degeneration, lumbar region with discogenic back pain and lower extremity pain: Secondary | ICD-10-CM | POA: Diagnosis not present

## 2024-02-03 DIAGNOSIS — M545 Low back pain, unspecified: Secondary | ICD-10-CM | POA: Diagnosis not present

## 2024-02-03 DIAGNOSIS — M5416 Radiculopathy, lumbar region: Secondary | ICD-10-CM | POA: Diagnosis not present

## 2024-02-03 DIAGNOSIS — R296 Repeated falls: Secondary | ICD-10-CM | POA: Diagnosis not present

## 2024-02-03 DIAGNOSIS — Z79899 Other long term (current) drug therapy: Secondary | ICD-10-CM | POA: Diagnosis not present

## 2024-02-13 DIAGNOSIS — M419 Scoliosis, unspecified: Secondary | ICD-10-CM | POA: Diagnosis not present

## 2024-02-13 DIAGNOSIS — M5416 Radiculopathy, lumbar region: Secondary | ICD-10-CM | POA: Diagnosis not present

## 2024-02-13 DIAGNOSIS — M47819 Spondylosis without myelopathy or radiculopathy, site unspecified: Secondary | ICD-10-CM | POA: Diagnosis not present

## 2024-02-18 DIAGNOSIS — N130 Hydronephrosis with ureteropelvic junction obstruction: Secondary | ICD-10-CM | POA: Diagnosis not present

## 2024-02-25 DIAGNOSIS — N130 Hydronephrosis with ureteropelvic junction obstruction: Secondary | ICD-10-CM | POA: Diagnosis not present

## 2024-03-02 ENCOUNTER — Other Ambulatory Visit: Payer: Self-pay | Admitting: Urology

## 2024-03-10 DIAGNOSIS — Z23 Encounter for immunization: Secondary | ICD-10-CM | POA: Diagnosis not present

## 2024-03-10 DIAGNOSIS — E782 Mixed hyperlipidemia: Secondary | ICD-10-CM | POA: Diagnosis not present

## 2024-03-10 DIAGNOSIS — F419 Anxiety disorder, unspecified: Secondary | ICD-10-CM | POA: Diagnosis not present

## 2024-03-10 DIAGNOSIS — I10 Essential (primary) hypertension: Secondary | ICD-10-CM | POA: Diagnosis not present

## 2024-03-10 DIAGNOSIS — R944 Abnormal results of kidney function studies: Secondary | ICD-10-CM | POA: Diagnosis not present

## 2024-03-10 DIAGNOSIS — I700 Atherosclerosis of aorta: Secondary | ICD-10-CM | POA: Diagnosis not present

## 2024-03-10 DIAGNOSIS — K219 Gastro-esophageal reflux disease without esophagitis: Secondary | ICD-10-CM | POA: Diagnosis not present

## 2024-03-10 DIAGNOSIS — E781 Pure hyperglyceridemia: Secondary | ICD-10-CM | POA: Diagnosis not present

## 2024-03-10 DIAGNOSIS — E1169 Type 2 diabetes mellitus with other specified complication: Secondary | ICD-10-CM | POA: Diagnosis not present

## 2024-03-10 DIAGNOSIS — M48061 Spinal stenosis, lumbar region without neurogenic claudication: Secondary | ICD-10-CM | POA: Diagnosis not present

## 2024-03-10 NOTE — Patient Instructions (Addendum)
 SURGICAL WAITING ROOM VISITATION  Patients having surgery or a procedure may have no more than 2 support people in the waiting area - these visitors may rotate.    Children under the age of 107 must have an adult with them who is not the patient.  Due to an increase in RSV and influenza rates and associated hospitalizations, children ages 88 and under may not visit patients in [facility] hospitals.  Visitors with respiratory illnesses are discouraged from visiting and should remain at home.  If the patient needs to stay at the hospital during part of their recovery, the visitor guidelines for inpatient rooms apply. Pre-op nurse will coordinate an appropriate time for 1 support person to accompany patient in pre-op.  This support person may not rotate.    Please refer to the [facility] website for the visitor guidelines for Inpatients (after your surgery is over and you are in a regular room).       Your procedure is scheduled on: 03/16/24   Report to [facility] Main Entrance    Report to admitting at 8:45 AM   Call this number if you have problems the morning of surgery [phone]   Do not eat food or drink liquids:After Midnight.but may have sips of water with meds.        Oral Hygiene is also important to reduce your risk of infection.                                    Remember - BRUSH YOUR TEETH THE MORNING OF SURGERY WITH YOUR REGULAR TOOTHPASTE  DENTURES WILL BE REMOVED PRIOR TO SURGERY PLEASE DO NOT APPLY "Poly grip" OR ADHESIVES!!!   Stop all vitamins and herbal supplements 7 days before surgery.   Take these medicines the morning of surgery with A SIP OF WATER: atenolol, cymbalta, ativan,  rosuvastatin.              Do not take Lisinopril/hydrochlorothiazide(zestoretic) the morning of surgery.  DO NOT TAKE ANY ORAL DIABETIC MEDICATIONS DAY OF YOUR SURGERY Hold Rebelsus the morning of surgery. Hold Jardiance for 72 hours prior to surgery. Last dose to be  May 3rd.             You may not have any metal on your body including hair pins, jewelry, and body piercing             Do not wear make-up, lotions, powders, perfumes/cologne, or deodorant  Do not wear nail polish including gel and S&S, artificial/acrylic nails, or any other type of covering on natural nails including finger and toenails. If you have artificial nails, gel coating, etc. that needs to be removed by a nail salon please have this removed prior to surgery or surgery may need to be canceled/ delayed if the surgeon/ anesthesia feels like they are unable to be safely monitored.   Do not shave  48 hours prior to surgery.    Do not bring valuables to the hospital. [facility] IS NOT             RESPONSIBLE   FOR VALUABLES.   Contacts, glasses, dentures or bridgework may not be worn into surgery.  DO NOT BRING YOUR HOME MEDICATIONS TO THE HOSPITAL. PHARMACY WILL DISPENSE MEDICATIONS LISTED ON YOUR MEDICATION LIST TO YOU DURING YOUR ADMISSION IN THE HOSPITAL!    Patients discharged on the day of surgery will  not be allowed to drive home.  Someone NEEDS to stay with you for the first 24 hours after anesthesia.   Special Instructions: Bring a copy of your healthcare power of attorney and living will documents the day of surgery if you haven't scanned them before.              Please read over the following fact sheets you were given: IF YOU HAVE QUESTIONS ABOUT YOUR PRE-OP INSTRUCTIONS PLEASE CALL [phone] Ashley Dean   If you received a COVID test during your pre-op visit  it is requested that you wear a mask when out in public, stay away from anyone that may not be feeling well and notify your surgeon if you develop symptoms. If you test positive for Covid or have been in contact with anyone that has tested positive in the last 10 days please notify you surgeon.    [facility] - Preparing for Surgery Before surgery, you can play an important role.  Because skin is not sterile, your  skin needs to be as free of germs as possible.  You can reduce the number of germs on your skin by washing with CHG (chlorahexidine gluconate) soap before surgery.  CHG is an antiseptic cleaner which kills germs and bonds with the skin to continue killing germs even after washing. Please DO NOT use if you have an allergy to CHG or antibacterial soaps.  If your skin becomes reddened/irritated stop using the CHG and inform your nurse when you arrive at Short Stay. Do not shave (including legs and underarms) for at least 48 hours prior to the first CHG shower.  You may shave your face/neck.  Please follow these instructions carefully:  1.  Shower with CHG Soap the night before surgery and the  morning of surgery.  2.  If you choose to wash your hair, wash your hair first as usual with your normal  shampoo.  3.  After you shampoo, rinse your hair and body thoroughly to remove the shampoo.                             4.  Use CHG as you would any other liquid soap.  You can apply chg directly to the skin and wash.  Gently with a scrungie or clean washcloth.  5.  Apply the CHG Soap to your body ONLY FROM THE NECK DOWN.   Do   not use on face/ open                           Wound or open sores. Avoid contact with eyes, ears mouth and   genitals (private parts).                       Wash face,  Genitals (private parts) with your normal soap.             6.  Wash thoroughly, paying special attention to the area where your    surgery  will be performed.  7.  Thoroughly rinse your body with warm water from the neck down.  8.  DO NOT shower/wash with your normal soap after using and rinsing off the CHG Soap.                9.  Pat yourself dry with a clean towel.  10.  Wear clean pajamas.            11.  Place clean sheets on your bed the night of your first shower and do not  sleep with pets. Day of Surgery : Do not apply any lotions/deodorants the morning of surgery.  Please wear clean clothes to  the hospital/surgery center.  FAILURE TO FOLLOW THESE INSTRUCTIONS MAY RESULT IN THE CANCELLATION OF YOUR SURGERY   How to Manage Your Diabetes Before and After Surgery  Why is it important to control my blood sugar before and after surgery? Improving blood sugar levels before and after surgery helps healing and can limit problems. A way of improving blood sugar control is eating a healthy diet by:  Eating less sugar and carbohydrates  Increasing activity/exercise  Talking with your doctor about reaching your blood sugar goals High blood sugars (greater than 180 mg/dL) can raise your risk of infections and slow your recovery, so you will need to focus on controlling your diabetes during the weeks before surgery. Make sure that the doctor who takes care of your diabetes knows about your planned surgery including the date and location.  How do I manage my blood sugar before surgery? Check your blood sugar at least 4 times a day, starting 2 days before surgery, to make sure that the level is not too high or low. Check your blood sugar the morning of your surgery when you wake up and every 2 hours until you get to the Short Stay unit. If your blood sugar is less than 70 mg/dL, you will need to treat for low blood sugar: Do not take insulin. Treat a low blood sugar (less than 70 mg/dL) with  cup of clear juice (cranberry or apple), 4 glucose tablets, OR glucose gel. Recheck blood sugar in 15 minutes after treatment (to make sure it is greater than 70 mg/dL). If your blood sugar is not greater than 70 mg/dL on recheck, call [phone] for further instructions. Report your blood sugar to the short stay nurse when you get to Short Stay.  If you are admitted to the hospital after surgery: Your blood sugar will be checked by the staff and you will probably be given insulin after surgery (instead of oral diabetes medicines) to make sure you have good blood sugar levels. The goal for blood sugar  control after surgery is 80-180 mg/dL.   WHAT DO I DO ABOUT MY DIABETES MEDICATION?  Do not take oral diabetes medicines (pills) the morning of surgery.   DO NOT TAKE THE FOLLOWING 7 DAYS PRIOR TO SURGERY: Ozempic, Wegovy, Rybelsus (Semaglutide), Byetta (exenatide), Bydureon (exenatide ER), Victoza, Saxenda (liraglutide), or Trulicity (dulaglutide) Mounjaro (Tirzepatide) Adlyxin (Lixisenatide), Polyethylene Glycol Loxenatide.  Patient Signature:  Date:   Nurse Signature:  Date:   Reviewed and Endorsed by [facility] Patient Education Committee, August 2015

## 2024-03-10 NOTE — Progress Notes (Addendum)
 COVID Vaccine received:  []  No [x]  Yes Date of any COVID positive Test in last 90 days: no PCP - Faustina Hood MD Cardiologist -   Chest x-ray -  EKG -   Stress Test -  ECHO -  Cardiac Cath -   Bowel Prep - [x]  No  []   Yes ______  Pacemaker / ICD device [x]  No []  Yes   Spinal Cord Stimulator:[x]  No []  Yes       History of Sleep Apnea? [x]  No []  Yes   CPAP used?- [x]  No []  Yes    Does the patient monitor blood sugar?          []  No [x]  Yes  []  N/A  Patient has: []  NO Hx DM   []  Pre-DM                 []  DM1  [x]   DM2 Does patient have a [employer] or Dexacom? [x]  No []  Yes   Fasting Blood Sugar Ranges- 120-230 Checks Blood Sugar _2____ times a day  GLP1 agonist / usual dose - no GLP1 instructions:  SGLT-2 inhibitors / usual dose - no SGLT-2 instructions:   Blood Thinner / Instructions:no Aspirin Instructions:no  Comments:   Activity level: Patient is able  to climb a flight of stairs without difficulty; [x]  No CP  [x]  No SOB, but would have _leg dysfunction__   Patient can perform ADLs without assistance.   Anesthesia review:   Patient denies shortness of breath, fever, cough and chest pain at PAT appointment.  Patient verbalized understanding and agreement to the Pre-Surgical Instructions that were given to them at this PAT appointment. Patient was also educated of the need to review these PAT instructions again prior to his/her surgery.I reviewed the appropriate phone numbers to call if they have any and questions or concerns.

## 2024-03-11 ENCOUNTER — Encounter (HOSPITAL_COMMUNITY): Payer: Self-pay

## 2024-03-11 ENCOUNTER — Encounter (HOSPITAL_COMMUNITY)
Admission: RE | Admit: 2024-03-11 | Discharge: 2024-03-11 | Disposition: A | Discharge status: Home / Self Care | Source: Ambulatory Visit | Attending: Urology | Admitting: Urology

## 2024-03-11 ENCOUNTER — Other Ambulatory Visit: Payer: Self-pay

## 2024-03-11 VITALS — BP 105/65 | HR 72 | Temp 98.0°F | Resp 18 | Ht 65.0 in | Wt 140.0 lb

## 2024-03-11 DIAGNOSIS — Z01812 Encounter for preprocedural laboratory examination: Secondary | ICD-10-CM | POA: Diagnosis not present

## 2024-03-11 DIAGNOSIS — E119 Type 2 diabetes mellitus without complications: Secondary | ICD-10-CM | POA: Diagnosis not present

## 2024-03-11 DIAGNOSIS — Z01818 Encounter for other preprocedural examination: Secondary | ICD-10-CM

## 2024-03-11 DIAGNOSIS — I10 Essential (primary) hypertension: Secondary | ICD-10-CM

## 2024-03-11 HISTORY — DX: Depression, unspecified: F32.A

## 2024-03-11 LAB — BASIC METABOLIC PANEL WITH GFR
Anion gap: 14 (ref 5–15)
BUN: 32 mg/dL — ABNORMAL HIGH (ref 8–23)
CO2: 28 mmol/L (ref 22–32)
Calcium: 9.8 mg/dL (ref 8.9–10.3)
Chloride: 99 mmol/L (ref 98–111)
Creatinine, Ser: 1.05 mg/dL — ABNORMAL HIGH (ref 0.44–1.00)
GFR, Estimated: 53 mL/min — ABNORMAL LOW (ref >60)
Glucose, Bld: 102 mg/dL — ABNORMAL HIGH (ref 70–99)
Potassium: 3.7 mmol/L (ref 3.5–5.1)
Sodium: 141 mmol/L (ref 135–145)

## 2024-03-11 LAB — CBC
HCT: 46.5 % — ABNORMAL HIGH (ref 36.0–46.0)
Hemoglobin: 14.8 g/dL (ref 12.0–15.0)
MCH: 28 pg (ref 26.0–34.0)
MCHC: 31.8 g/dL (ref 30.0–36.0)
MCV: 88.1 fL (ref 80.0–100.0)
Platelets: 185 10*3/uL (ref 150–400)
RBC: 5.28 MIL/uL — ABNORMAL HIGH (ref 3.87–5.11)
RDW: 15.4 % (ref 11.5–15.5)
WBC: 8.9 10*3/uL (ref 4.0–10.5)
nRBC: 0 % (ref 0.0–0.2)

## 2024-03-11 LAB — HEMOGLOBIN A1C
Hgb A1c MFr Bld: 5.9 % — ABNORMAL HIGH (ref 4.8–5.6)
Mean Plasma Glucose: 122.63 mg/dL

## 2024-03-11 LAB — GLUCOSE, CAPILLARY: Glucose-Capillary: 115 mg/dL — ABNORMAL HIGH (ref 70–99)

## 2024-03-16 ENCOUNTER — Ambulatory Visit (HOSPITAL_BASED_OUTPATIENT_CLINIC_OR_DEPARTMENT_OTHER): Payer: Self-pay | Admitting: Certified Registered"

## 2024-03-16 ENCOUNTER — Encounter (HOSPITAL_COMMUNITY): Payer: Self-pay | Admitting: Urology

## 2024-03-16 ENCOUNTER — Ambulatory Visit (HOSPITAL_COMMUNITY)
Admission: RE | Admit: 2024-03-16 | Discharge: 2024-03-16 | Disposition: A | Discharge status: Home / Self Care | Attending: Urology | Admitting: Urology

## 2024-03-16 ENCOUNTER — Ambulatory Visit (HOSPITAL_COMMUNITY): Payer: Self-pay | Admitting: Certified Registered"

## 2024-03-16 ENCOUNTER — Encounter (HOSPITAL_COMMUNITY)
Admission: RE | Disposition: A | Discharge status: Home / Self Care | Payer: Self-pay | Source: Home / Self Care | Attending: Urology

## 2024-03-16 ENCOUNTER — Other Ambulatory Visit: Payer: Self-pay

## 2024-03-16 ENCOUNTER — Ambulatory Visit (HOSPITAL_COMMUNITY)

## 2024-03-16 DIAGNOSIS — N131 Hydronephrosis with ureteral stricture, not elsewhere classified: Secondary | ICD-10-CM | POA: Insufficient documentation

## 2024-03-16 DIAGNOSIS — E119 Type 2 diabetes mellitus without complications: Secondary | ICD-10-CM

## 2024-03-16 DIAGNOSIS — Z87891 Personal history of nicotine dependence: Secondary | ICD-10-CM | POA: Diagnosis not present

## 2024-03-16 DIAGNOSIS — M797 Fibromyalgia: Secondary | ICD-10-CM | POA: Insufficient documentation

## 2024-03-16 DIAGNOSIS — N130 Hydronephrosis with ureteropelvic junction obstruction: Secondary | ICD-10-CM | POA: Diagnosis not present

## 2024-03-16 DIAGNOSIS — M4185 Other forms of scoliosis, thoracolumbar region: Secondary | ICD-10-CM | POA: Insufficient documentation

## 2024-03-16 DIAGNOSIS — N132 Hydronephrosis with renal and ureteral calculous obstruction: Secondary | ICD-10-CM

## 2024-03-16 DIAGNOSIS — F419 Anxiety disorder, unspecified: Secondary | ICD-10-CM | POA: Diagnosis not present

## 2024-03-16 DIAGNOSIS — N200 Calculus of kidney: Secondary | ICD-10-CM | POA: Diagnosis present

## 2024-03-16 DIAGNOSIS — I10 Essential (primary) hypertension: Secondary | ICD-10-CM | POA: Diagnosis not present

## 2024-03-16 HISTORY — PX: CYSTOSCOPY/URETEROSCOPY/HOLMIUM LASER/STENT PLACEMENT: SHX6546

## 2024-03-16 LAB — GLUCOSE, CAPILLARY
Glucose-Capillary: 146 mg/dL — ABNORMAL HIGH (ref 70–99)
Glucose-Capillary: 117 mg/dL — ABNORMAL HIGH (ref 70–99)

## 2024-03-16 SURGERY — CYSTOSCOPY/URETEROSCOPY/HOLMIUM LASER/STENT PLACEMENT
Anesthesia: General | Laterality: Right

## 2024-03-16 MED ORDER — ONDANSETRON HCL 4 MG/2ML IJ SOLN: 4.0000 mg | Freq: Four times a day (QID) | INTRAMUSCULAR | Status: DC | PRN

## 2024-03-16 MED ORDER — OXYCODONE HCL 5 MG PO TABS: 5.0000 mg | ORAL_TABLET | Freq: Once | ORAL | Status: DC | PRN

## 2024-03-16 MED ORDER — OXYBUTYNIN CHLORIDE 5 MG PO TABS: 5.0000 mg | ORAL_TABLET | Freq: Three times a day (TID) | ORAL | 1 refills | Status: DC | PRN

## 2024-03-16 MED ORDER — FENTANYL CITRATE (PF) 100 MCG/2ML IJ SOLN
INTRAMUSCULAR | Status: AC
  Filled 2024-03-16: qty 2

## 2024-03-16 MED ORDER — EPHEDRINE SULFATE-NACL 50-0.9 MG/10ML-% IV SOSY
PREFILLED_SYRINGE | INTRAVENOUS | Status: DC | PRN
  Administered 2024-03-16 (×2): 5 mg via INTRAVENOUS
  Administered 2024-03-16: 10 mg via INTRAVENOUS
  Administered 2024-03-16: 5 mg via INTRAVENOUS

## 2024-03-16 MED ORDER — GLYCOPYRROLATE 0.2 MG/ML IJ SOLN
INTRAMUSCULAR | Status: DC | PRN
  Administered 2024-03-16 (×2): .1 mg via INTRAVENOUS

## 2024-03-16 MED ORDER — LACTATED RINGERS IV SOLN: INTRAVENOUS | Status: DC

## 2024-03-16 MED ORDER — FENTANYL CITRATE PF 50 MCG/ML IJ SOSY: 25.0000 ug | PREFILLED_SYRINGE | INTRAMUSCULAR | Status: DC | PRN

## 2024-03-16 MED ORDER — OXYCODONE HCL 5 MG/5ML PO SOLN: 5.0000 mg | Freq: Once | ORAL | Status: DC | PRN

## 2024-03-16 MED ORDER — LIDOCAINE HCL (CARDIAC) PF 100 MG/5ML IV SOSY
PREFILLED_SYRINGE | INTRAVENOUS | Status: DC | PRN
  Administered 2024-03-16: 50 mg via INTRAVENOUS

## 2024-03-16 MED ORDER — CEFAZOLIN SODIUM-DEXTROSE 2-4 GM/100ML-% IV SOLN
2.0000 g | INTRAVENOUS | Status: AC
  Administered 2024-03-16: 2 g via INTRAVENOUS
  Filled 2024-03-16: qty 100

## 2024-03-16 MED ORDER — DEXAMETHASONE SODIUM PHOSPHATE 10 MG/ML IJ SOLN
INTRAMUSCULAR | Status: DC | PRN
  Administered 2024-03-16: 4 mg via INTRAVENOUS

## 2024-03-16 MED ORDER — DEXAMETHASONE SODIUM PHOSPHATE 10 MG/ML IJ SOLN
INTRAMUSCULAR | Status: AC
  Filled 2024-03-16: qty 1

## 2024-03-16 MED ORDER — PHENYLEPHRINE 80 MCG/ML (10ML) SYRINGE FOR IV PUSH (FOR BLOOD PRESSURE SUPPORT)
PREFILLED_SYRINGE | INTRAVENOUS | Status: AC
  Filled 2024-03-16: qty 20

## 2024-03-16 MED ORDER — CHLORHEXIDINE GLUCONATE 0.12 % MT SOLN
15.0000 mL | Freq: Once | OROMUCOSAL | Status: AC
  Administered 2024-03-16: 15 mL via OROMUCOSAL

## 2024-03-16 MED ORDER — INSULIN ASPART 100 UNIT/ML IJ SOLN: 0.0000 [IU] | INTRAMUSCULAR | Status: DC | PRN

## 2024-03-16 MED ORDER — PHENYLEPHRINE 80 MCG/ML (10ML) SYRINGE FOR IV PUSH (FOR BLOOD PRESSURE SUPPORT)
PREFILLED_SYRINGE | INTRAVENOUS | Status: DC | PRN
  Administered 2024-03-16 (×3): 80 ug via INTRAVENOUS

## 2024-03-16 MED ORDER — FENTANYL CITRATE (PF) 100 MCG/2ML IJ SOLN
INTRAMUSCULAR | Status: DC | PRN
  Administered 2024-03-16 (×2): 25 ug via INTRAVENOUS

## 2024-03-16 MED ORDER — IOHEXOL 300 MG/ML  SOLN
INTRAMUSCULAR | Status: DC | PRN
Start: 2024-03-16 — End: 2024-03-16
  Administered 2024-03-16: 9 mL via URETHRAL

## 2024-03-16 MED ORDER — ORAL CARE MOUTH RINSE: 15.0000 mL | Freq: Once | OROMUCOSAL | Status: AC

## 2024-03-16 MED ORDER — CEPHALEXIN 500 MG PO CAPS: 500.0000 mg | ORAL_CAPSULE | Freq: Two times a day (BID) | ORAL | 0 refills | Status: AC

## 2024-03-16 MED ORDER — ONDANSETRON HCL 4 MG/2ML IJ SOLN
INTRAMUSCULAR | Status: AC
  Filled 2024-03-16: qty 2

## 2024-03-16 MED ORDER — PROPOFOL 10 MG/ML IV BOLUS
INTRAVENOUS | Status: DC | PRN
Start: 2024-03-16 — End: 2024-03-16
  Administered 2024-03-16: 120 mg via INTRAVENOUS

## 2024-03-16 MED ORDER — ONDANSETRON HCL 4 MG/2ML IJ SOLN
INTRAMUSCULAR | Status: DC | PRN
  Administered 2024-03-16: 4 mg via INTRAVENOUS

## 2024-03-16 MED ORDER — PROPOFOL 10 MG/ML IV BOLUS
INTRAVENOUS | Status: AC
  Filled 2024-03-16: qty 20

## 2024-03-16 SURGICAL SUPPLY — 19 items
BAG URO CATCHER STRL LF (MISCELLANEOUS) ×1 IMPLANT
BASKET ZERO TIP NITINOL 2.4FR (BASKET) IMPLANT
CATH URETL OPEN 5X70 (CATHETERS) ×1 IMPLANT
CLOTH BEACON ORANGE TIMEOUT ST (SAFETY) ×1 IMPLANT
EXTRACTOR STONE NITINOL NGAGE (UROLOGICAL SUPPLIES) IMPLANT
FIBER LASER MOSES 200 DFL (Laser) IMPLANT
FIBER LASER MOSES 365 DFL (Laser) IMPLANT
GLOVE SURG LX STRL 8.0 MICRO (GLOVE) ×1 IMPLANT
GOWN STRL SURGICAL XL XLNG (GOWN DISPOSABLE) ×1 IMPLANT
GUIDEWIRE STR DUAL SENSOR (WIRE) IMPLANT
GUIDEWIRE ZIPWRE .038 STRAIGHT (WIRE) ×1 IMPLANT
KIT TURNOVER KIT A (KITS) IMPLANT
MANIFOLD NEPTUNE II (INSTRUMENTS) ×1 IMPLANT
PACK CYSTO (CUSTOM PROCEDURE TRAY) ×1 IMPLANT
SHEATH NAVIGATOR HD 11/13X36 (SHEATH) IMPLANT
STENT URET 6FRX24 CONTOUR (STENTS) IMPLANT
STENT URET 6FRX26 CONTOUR (STENTS) IMPLANT
TUBING CONNECTING 10 (TUBING) ×1 IMPLANT
TUBING UROLOGY SET (TUBING) ×1 IMPLANT

## 2024-03-16 NOTE — H&P (Signed)
 Office Visit Report     02/25/2024   --------------------------------------------------------------------------------   Ashley Dean. Perlmutter  MRN: [id number]  DOB: 10/04/1941, 83 year old Female  PRIMARY CARE:  Mar Semen, MD  PRIMARY CARE FAX:  [phone]  REFERRING:  Raymon Caldron, NP  PROVIDER:  Yevonne Heman, M.D.  LOCATION:  [facility] [phone]     --------------------------------------------------------------------------------   CC/HPI: Right UPJO   [name] is a 83 year old female, referred by Dr. Inga Manges, after she is found to have moderate to severe right-sided hydronephrosis and findings suggestive of a right UPJ obstruction on recent CT, Lasix  renogram and renal ultrasound. She has a long history of kidney stones. Recent kidney stone interventions, include left ureteroscopy and left ESWL. She denies any prior surgical intervention involving her right kidney. Review of her CT scan from April 2022 revealed an an extrarenal pelvis on the right versus very mild hydronephrosis. Recent renal ultrasound and CT scan showed a significant exacerbation in her right-sided hydronephrosis. Lasix  renogram from 04/02/2022 revealed delayed excretion from the right kidney with a differential function of 53% on the right and 47% on the left. The right collecting system was noted to be severely dilated with failure to clear the radiotracer over the course of the study, consistent with urinary outflow obstruction.   04/11/2022: Today, she denies any significant flank pain, especially on the right. 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 She states that she is voiding without difficulty and denies any recent episodes of dysuria, hematuria or UTIs.   06/13/2022: The patient is here today for routine follow-up. She underwent a diagnostic ureteroscopy on 04/16/2022 that revealed no intrinsic obstruction of her right  UPJ. Subsequent CT showed no evidence of a crossing vessel, but she has significant right word scoliosis of her lumbar and thoracic spine and I suspect that is the source of her hydronephrosis/UPJO. Since her ureteroscopy, the patient denies any significant flank pain, dysuria or hematuria.   12/18/22: The patient is here today for a routine follow-up. She has done well over the past 6 months and reports stable, generalized joint and back pain. She specifically denies any right-sided flank pain, interval UTIs, dysuria or hematuria. Serum creatinine is WNL.   07/29/2023: [name] is an 83 year old female who presents today for follow-up regarding right-sided hydronephrosis. She is doing well overall but does have some left-sided flank pain. This began after a fall where she landed on her left side. She denies gross hematuria and passage of stone material.   02/25/24: The patient is here today for a routine follow-up and RUS. Her renal function has declined significantly since her visit in August and her GFR now is 42.3 with a creatinine of 1.2 (previously 81.3 and 0.7, respectively). She denies any significant changes in her medications denies NSAID use. She also denies any significant right-sided flank pain, but continues to have chronic low back pain due to her severe scoliosis. Renal ultrasound today shows stable right-sided hydronephrosis compared to her ultrasound in August.     ALLERGIES: Macrodantin CAPS    MEDICATIONS: Atenolol 50 MG Oral Tablet Oral  Calcium  Crestor 10 MG Oral Tablet Oral  Cymbalta 20 MG Capsule Delayed Release Particles Oral  Jardiance  Lisinopril-Hydrochlorothiazide 20-25 MG Oral Tablet Oral  Ocuvite-Lutein Tablet Oral  oxyCODONE  HCl  Rybelsus  Vitamin D3     GU PSH: Cystoscopy Insert Stent - 04/16/2022 ESWL - 2017, 2011  Hysterectomy Unilat SO - 2016 Locm 300-399Mg /Ml Iodine,1Ml - 04/22/2022, 2018 Remove Kidney Stone - 2011 Ureteroscopic stone removal, Left -  2018       PSH Notes: Renal Lithotripsy, Hysterectomy, Lithotomy, Foot Surgery, Cholecystectomy, Lithotripsy   NON-GU PSH: Cholecystectomy (open) - 2011 Visit Complexity (formerly GPC1X) - 12/18/2022     GU PMH: Hydronephrosis - 07/29/2023, - 12/18/2022, - 06/13/2022, - 04/22/2022, - 04/11/2022, She has marked right hydro that is most consistent with a UPJ obstruction. I am going to get a lasix  renogram and if obstruction is confirmed, I will consider cystoscopy with retrograde and stent or referral for consideration of a pyeloplasty. She will return with the results. , - 03/18/2022 Renal calculus - 07/29/2023, - 04/18/2022, There is a tiny calcification that is at the upper pole of the right kidney but it may be vascular. No other stones seen. , - 03/18/2022, - 2022, She has bilateral renal stones but they are small and don't appear obstructing. I will have her return in 1 year with a CT stone study. , - 2021, - 2020, Kidney stone on left side, - 2017 Urge incontinence, He has some UUI if she is not careful but it is not severe. - 2019 Microscopic hematuria - 2018 Ureteral calculus, She has a possible LUVJ stone but I am not sure that is what is causing her pain. She does have some hematuria. I am going to give her rapaflo since she has a sulfa allergy. She will return in about a months with a KUB. - 2018 LUQ pain, Abdominal pain, LUQ (left upper quadrant) - 2017 RUQ pain, Abdominal pain, RUQ (right upper quadrant) - 2016 History of urolithiasis, Nephrolithiasis - 2014      PMH Notes:  2010-01-09 12:30:05 - Note: Arthritis   NON-GU PMH: Other forms of scoliosis, thoracolumbar region - 06/13/2022 Encounter for general adult medical examination without abnormal findings, Encounter for preventive health examination - 2017 Personal history of other diseases of the nervous system and sense organs, History of sleep apnea - 2016 Personal history of other diseases of the circulatory system, History of hypertension  - 2014 Personal history of other endocrine, nutritional and metabolic disease, History of hypercholesterolemia - 2014, History of diabetes mellitus, - 2014 Personal history of other mental and behavioral disorders, History of depression - 2014    FAMILY HISTORY: Colon Cancer - Father Death In The Family Father - Father Death In The Family Mother - Father Family Health Status Number - Father   SOCIAL HISTORY: Marital Status: Married Preferred Language: English; Race: White     Notes: Retired, 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  Gastrointestinal (Upper):   Patient denies nausea, vomiting, and indigestion/ heartburn.  Gastrointestinal (Lower):   Patient denies diarrhea and constipation.  Constitutional:   Patient denies fever, night sweats, weight loss, and fatigue.  Skin:   Patient denies skin rash/ lesion and itching.  Eyes:   Patient denies blurred vision and double vision.  Ears/ Nose/ Throat:   Patient denies sore throat and sinus problems.  Hematologic/Lymphatic:   Patient denies swollen glands and easy bruising.  Cardiovascular:   Patient denies leg swelling  and chest pains.  Respiratory:   Patient denies cough and shortness of breath.  Endocrine:   Patient denies excessive thirst.  Musculoskeletal:   Patient denies joint pain and back pain.  Neurological:   Patient denies headaches and dizziness.  Psychologic:   Patient denies depression and anxiety.   VITAL SIGNS: None   Complexity of Data:  Lab Test Review:   BMP  Records Review:   Previous Patient Records  X-Ray Review: Renal Ultrasound: Reviewed Films. Discussed With  Patient.     02/18/24 06/11/23 12/12/22 04/11/22  General Chemistry  Sodium 146 mEq/L 143 mEq/L 137 mEq/L 139 mEq/L  Potassium 4.0 mEq/L 4.3 mEq/L 3.4 mEq/L 4.3 mEq/L  BUN 34 mg/dL 21 mg/dL 20 mg/dL 25 mg/dL  Creatinine 1.2 mg/dL 0.7 mg/dL 1.0 mg/dL 0.6 mg/dL  Chloride 99 mEq/L 161 mEq/L 97 mEq/L 102 mEq/L  CO2 36 mEq/L 29 mEq/L 33 mEq/L 34 mEq/L  Glucose 147 mg/dL 096 mg/dL 045 mg/dL 409 mg/dL  Calcium 81.1 mg/dL 9.2 mg/dL 9.4 mg/dL 9.7 mg/dL  eGFR African American 49.1  94.2  61.6  100.5   eGFR Non-Afr. American 42.3  81.3  53.2  86.7    PROCEDURES:         Renal Ultrasound - 91478  Right Kidney: Length: 9.6 cm Depth: 5.5 cm Cortical Width: 1.1 cm Width: 4.2cm  Left Kidney: Length: 9.7 cm Depth: 4.9 cm Cortical Width: 1.2cm Width: 4.4 cm  Left Kidney/Ureter:  multiple cysts noted, .3cm cortical calc noted   Right Kidney/Ureter:  stable hydro noted, calc in lower pole measures .43cm, cysts prev. seen not seen on today's study.   Bladder:  PVR= 22.37ml      . Patient confirmed No Neulasta OnPro Device.   Stable hydronephrosis involving the right kidney compared to her RUS from 07/29/23. No solid parenchymal lesions are seen involving the right kidney. No hydronephrosis seen involving the left kidney. No stone shadowing or solid parenchymal lesions observed. The left renal echogenecity appears normal. The bladder lumen has a smooth contour with no masses or debris.          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

## 2024-03-16 NOTE — Anesthesia Postprocedure Evaluation (Signed)
 Anesthesia Post Note  Patient: Ashley Dean  Procedure(s) Performed: CYSTOSCOPY/URETEROSCOPY/STENT PLACEMENT/RETROGRADE (Right)     Patient location during evaluation: PACU Anesthesia Type: General Level of consciousness: awake and alert Pain management: pain level controlled Vital Signs Assessment: post-procedure vital signs reviewed and stable Respiratory status: spontaneous breathing, nonlabored ventilation, respiratory function stable and patient connected to nasal cannula oxygen Cardiovascular status: blood pressure returned to baseline and stable Postop Assessment: no apparent nausea or vomiting Anesthetic complications: no   No notable events documented.  Last Vitals:  Vitals:   03/16/24 1230 03/16/24 1245  BP: (!) 147/76 130/71  Pulse: 72 75  Resp: 15 18  Temp:    SpO2: 100% 95%    Last Pain:  Vitals:   03/16/24 1245  TempSrc:   PainSc: 0-No pain                 Hilary Milks S

## 2024-03-16 NOTE — Transfer of Care (Signed)
 Immediate Anesthesia Transfer of Care Note  Patient: Ashley Dean  Procedure(s) Performed: CYSTOSCOPY/URETEROSCOPY/STENT PLACEMENT/RETROGRADE (Right)  Patient Location: PACU  Anesthesia Type:General  Level of Consciousness: awake, alert , and patient cooperative  Airway & Oxygen Therapy: Patient Spontanous Breathing and Patient connected to face mask oxygen  Post-op Assessment: Report given to RN and Post -op Vital signs reviewed and stable  Post vital signs: Reviewed and stable  Last Vitals:  Vitals Value Taken Time  BP 141/77   Temp    Pulse 71   Resp 16   SpO2 100     Last Pain:  Vitals:   03/16/24 0916  TempSrc:   PainSc: 6       Patients Stated Pain Goal: 5 (03/16/24 0916)  Complications: No notable events documented.

## 2024-03-16 NOTE — Op Note (Signed)
 Operative Note  Preoperative diagnosis:  1.  Chronic right UPJ obstruction  Postoperative diagnosis: 1.  Chronic right UPJ obstruction  Procedure(s): 1.  Cystoscopy with right ureteroscopy and ureteral stent placement 2.  Right retrograde pyelogram with intraoperative interpretation fluoroscopic imaging  Surgeon: Yevonne Heman, MD  Assistants:  None  Anesthesia:  General  Complications:  None  EBL: Less than 5 mL  Specimens: 1.  None  Drains/Catheters: 1.  Right 6 French, 24 cm JJ stent without tether  Intraoperative findings:   Right retrograde pyelogram revealed no filling defects within the distal or midportion of the right ureter.  There was narrowing of the right UPJ with uniform dilation of the right renal pelvis and all associated calyces with no other filling defects. Widely patent right UPJ seen during ureteroscopy with no ureteral mucosal abnormalities, evidence of ureteral stricture disease or stone burden No intravesical or urethral abnormalities were seen  Indication:  Ashley Dean is a 83 y.o. female with a chronic right UPJ obstruction with a recent decline in renal function.  Otherwise, she remains completely asymptomatic from her chronic right UPJ obstruction.  She has been consented for the above procedures, voices understanding and wished to proceed.    Description of procedure:  After informed consent was obtained, the patient was brought to the operating room and general LMA anesthesia was administered. The patient was then placed in the dorsolithotomy position and prepped and draped in the usual sterile fashion. A timeout was performed. A 23 French rigid cystoscope was then inserted into the urethral meatus and advanced into the bladder under direct vision. A complete bladder survey revealed no intravesical pathology.  A 5 French ureteral catheter was then inserted into the right ureteral orifice and a retrograde pyelogram was obtained, with the  findings listed above.  A Glidewire was then used to intubate the lumen of the ureteral catheter and was advanced up to the right renal pelvis, under fluoroscopic guidance.  The catheter was then removed, leaving the wire in place.  A flexible ureteroscope was then advanced alongside the wire and up the right ureter.  The right UPJ was found to be widely patent and inspection of the right renal pelvis and all associated calyces revealed no other abnormalities or stone burden.  The flexible ureteroscope was then retracted down the right ureter with no intraluminal abnormalities identified.  A 6 French, 24 cm JJ stent was then advanced over the wire and into good position within the right collecting system, confirming placement via fluoroscopy.  The patient's bladder was drained.  She tolerated the procedure well and was transferred to the postanesthesia in stable condition.  Plan: Follow-up in 1 week for office cystoscopy and stent removal

## 2024-03-16 NOTE — Anesthesia Procedure Notes (Signed)
 Procedure Name: LMA Insertion Date/Time: 03/16/2024 11:43 AM  Performed by: Alwyn Juba, CRNAPre-anesthesia Checklist: Patient identified, Emergency Drugs available, Suction available, Patient being monitored and Timeout performed Patient Re-evaluated:Patient Re-evaluated prior to induction Oxygen Delivery Method: Circle system utilized Preoxygenation: Pre-oxygenation with 100% oxygen Induction Type: IV induction Ventilation: Mask ventilation without difficulty LMA: LMA inserted LMA Size: 4.0 Number of attempts: 1 Tube secured with: Tape Dental Injury: Teeth and Oropharynx as per pre-operative assessment

## 2024-03-16 NOTE — Anesthesia Preprocedure Evaluation (Signed)
 Anesthesia Evaluation  Patient identified by MRN, date of birth, ID band Patient awake    Reviewed: Allergy & Precautions, H&P , NPO status , Patient's Chart, lab work & pertinent test results  Airway Mallampati: II   Neck ROM: full    Dental   Pulmonary former smoker   breath sounds clear to auscultation       Cardiovascular hypertension,  Rhythm:regular Rate:Normal     Neuro/Psych  Headaches PSYCHIATRIC DISORDERS  Depression     Neuromuscular disease    GI/Hepatic   Endo/Other  diabetes, Type 2    Renal/GU      Musculoskeletal  (+) Arthritis ,  Fibromyalgia -  Abdominal   Peds  Hematology   Anesthesia Other Findings   Reproductive/Obstetrics                             Anesthesia Physical Anesthesia Plan  ASA: 3  Anesthesia Plan: General   Post-op Pain Management:    Induction: Intravenous  PONV Risk Score and Plan: 3 and Ondansetron , Dexamethasone  and Treatment may vary due to age or medical condition  Airway Management Planned: LMA  Additional Equipment:   Intra-op Plan:   Post-operative Plan: Extubation in OR  Informed Consent: I have reviewed the patients History and Physical, chart, labs and discussed the procedure including the risks, benefits and alternatives for the proposed anesthesia with the patient or authorized representative who has indicated his/her understanding and acceptance.     Dental advisory given  Plan Discussed with: CRNA, Anesthesiologist and Surgeon  Anesthesia Plan Comments:        Anesthesia Quick Evaluation

## 2024-03-17 ENCOUNTER — Encounter (HOSPITAL_COMMUNITY): Payer: Self-pay | Admitting: Urology

## 2024-03-22 DIAGNOSIS — M48061 Spinal stenosis, lumbar region without neurogenic claudication: Secondary | ICD-10-CM | POA: Diagnosis not present

## 2024-03-22 DIAGNOSIS — M5416 Radiculopathy, lumbar region: Secondary | ICD-10-CM | POA: Diagnosis not present

## 2024-03-24 DIAGNOSIS — N130 Hydronephrosis with ureteropelvic junction obstruction: Secondary | ICD-10-CM | POA: Diagnosis not present

## 2024-04-19 DIAGNOSIS — M5416 Radiculopathy, lumbar region: Secondary | ICD-10-CM | POA: Diagnosis not present

## 2024-04-28 DIAGNOSIS — N130 Hydronephrosis with ureteropelvic junction obstruction: Secondary | ICD-10-CM | POA: Diagnosis not present

## 2024-05-17 DIAGNOSIS — N3000 Acute cystitis without hematuria: Secondary | ICD-10-CM | POA: Diagnosis not present

## 2024-06-10 DIAGNOSIS — R3915 Urgency of urination: Secondary | ICD-10-CM | POA: Diagnosis not present

## 2024-06-10 DIAGNOSIS — N130 Hydronephrosis with ureteropelvic junction obstruction: Secondary | ICD-10-CM | POA: Diagnosis not present

## 2024-06-10 DIAGNOSIS — R300 Dysuria: Secondary | ICD-10-CM | POA: Diagnosis not present

## 2024-07-08 DIAGNOSIS — N3000 Acute cystitis without hematuria: Secondary | ICD-10-CM | POA: Diagnosis not present

## 2024-07-08 DIAGNOSIS — N2889 Other specified disorders of kidney and ureter: Secondary | ICD-10-CM | POA: Diagnosis not present

## 2024-07-08 DIAGNOSIS — N130 Hydronephrosis with ureteropelvic junction obstruction: Secondary | ICD-10-CM | POA: Diagnosis not present

## 2024-07-08 DIAGNOSIS — N281 Cyst of kidney, acquired: Secondary | ICD-10-CM | POA: Diagnosis not present

## 2024-07-11 DIAGNOSIS — R110 Nausea: Secondary | ICD-10-CM | POA: Diagnosis not present

## 2024-07-11 DIAGNOSIS — R21 Rash and other nonspecific skin eruption: Secondary | ICD-10-CM | POA: Diagnosis not present

## 2024-07-11 DIAGNOSIS — H6121 Impacted cerumen, right ear: Secondary | ICD-10-CM | POA: Diagnosis not present

## 2024-07-11 DIAGNOSIS — B370 Candidal stomatitis: Secondary | ICD-10-CM | POA: Diagnosis not present

## 2024-07-21 DIAGNOSIS — N952 Postmenopausal atrophic vaginitis: Secondary | ICD-10-CM | POA: Diagnosis not present

## 2024-07-21 DIAGNOSIS — R300 Dysuria: Secondary | ICD-10-CM | POA: Diagnosis not present

## 2024-07-21 DIAGNOSIS — R309 Painful micturition, unspecified: Secondary | ICD-10-CM | POA: Diagnosis not present

## 2024-07-21 DIAGNOSIS — M48061 Spinal stenosis, lumbar region without neurogenic claudication: Secondary | ICD-10-CM | POA: Diagnosis not present

## 2024-07-21 DIAGNOSIS — R3982 Chronic bladder pain: Secondary | ICD-10-CM | POA: Diagnosis not present

## 2024-07-21 DIAGNOSIS — M5416 Radiculopathy, lumbar region: Secondary | ICD-10-CM | POA: Diagnosis not present

## 2024-08-07 ENCOUNTER — Other Ambulatory Visit (HOSPITAL_COMMUNITY): Payer: Self-pay

## 2024-08-07 DIAGNOSIS — N390 Urinary tract infection, site not specified: Secondary | ICD-10-CM | POA: Diagnosis not present

## 2024-08-07 MED ORDER — AMOXICILLIN 875 MG PO TABS
875.0000 mg | ORAL_TABLET | Freq: Two times a day (BID) | ORAL | 0 refills | Status: AC
  Filled 2024-08-07: qty 20, 10d supply, fill #0

## 2024-08-13 ENCOUNTER — Other Ambulatory Visit (HOSPITAL_COMMUNITY): Payer: Self-pay

## 2024-08-13 MED ORDER — CEPHALEXIN 500 MG PO CAPS
500.0000 mg | ORAL_CAPSULE | Freq: Two times a day (BID) | ORAL | 0 refills | Status: AC
  Filled 2024-08-13: qty 14, 7d supply, fill #0

## 2024-08-17 ENCOUNTER — Other Ambulatory Visit (HOSPITAL_COMMUNITY): Payer: Self-pay

## 2024-08-17 DIAGNOSIS — N390 Urinary tract infection, site not specified: Secondary | ICD-10-CM | POA: Diagnosis not present

## 2024-08-17 MED ORDER — SULFAMETHOXAZOLE-TRIMETHOPRIM 800-160 MG PO TABS
1.0000 | ORAL_TABLET | Freq: Two times a day (BID) | ORAL | 0 refills | Status: DC
  Filled 2024-08-17: qty 14, 7d supply, fill #0

## 2024-08-17 MED ORDER — PHENAZOPYRIDINE HCL 200 MG PO TABS
200.0000 mg | ORAL_TABLET | Freq: Three times a day (TID) | ORAL | 0 refills | Status: DC
  Filled 2024-08-17: qty 6, 2d supply, fill #0

## 2024-08-30 DIAGNOSIS — R399 Unspecified symptoms and signs involving the genitourinary system: Secondary | ICD-10-CM | POA: Diagnosis not present

## 2024-08-30 DIAGNOSIS — N3020 Other chronic cystitis without hematuria: Secondary | ICD-10-CM | POA: Diagnosis not present

## 2024-08-30 DIAGNOSIS — N3281 Overactive bladder: Secondary | ICD-10-CM | POA: Diagnosis not present

## 2024-08-30 DIAGNOSIS — N952 Postmenopausal atrophic vaginitis: Secondary | ICD-10-CM | POA: Diagnosis not present

## 2024-09-08 DIAGNOSIS — R399 Unspecified symptoms and signs involving the genitourinary system: Secondary | ICD-10-CM | POA: Diagnosis not present

## 2024-09-08 DIAGNOSIS — Q6211 Congenital occlusion of ureteropelvic junction: Secondary | ICD-10-CM | POA: Diagnosis not present

## 2024-09-08 DIAGNOSIS — R301 Vesical tenesmus: Secondary | ICD-10-CM | POA: Diagnosis not present

## 2024-09-19 DIAGNOSIS — F5101 Primary insomnia: Secondary | ICD-10-CM | POA: Diagnosis not present

## 2024-09-19 DIAGNOSIS — I10 Essential (primary) hypertension: Secondary | ICD-10-CM | POA: Diagnosis not present

## 2024-09-19 DIAGNOSIS — Z0000 Encounter for general adult medical examination without abnormal findings: Secondary | ICD-10-CM | POA: Diagnosis not present

## 2024-09-19 DIAGNOSIS — E782 Mixed hyperlipidemia: Secondary | ICD-10-CM | POA: Diagnosis not present

## 2024-09-19 DIAGNOSIS — Z1331 Encounter for screening for depression: Secondary | ICD-10-CM | POA: Diagnosis not present

## 2024-09-19 DIAGNOSIS — E1121 Type 2 diabetes mellitus with diabetic nephropathy: Secondary | ICD-10-CM | POA: Diagnosis not present

## 2024-09-19 DIAGNOSIS — K219 Gastro-esophageal reflux disease without esophagitis: Secondary | ICD-10-CM | POA: Diagnosis not present

## 2024-09-19 DIAGNOSIS — E1169 Type 2 diabetes mellitus with other specified complication: Secondary | ICD-10-CM | POA: Diagnosis not present

## 2024-09-19 DIAGNOSIS — F419 Anxiety disorder, unspecified: Secondary | ICD-10-CM | POA: Diagnosis not present

## 2024-09-19 DIAGNOSIS — R8290 Unspecified abnormal findings in urine: Secondary | ICD-10-CM | POA: Diagnosis not present

## 2024-09-19 DIAGNOSIS — M797 Fibromyalgia: Secondary | ICD-10-CM | POA: Diagnosis not present

## 2024-09-26 ENCOUNTER — Encounter (HOSPITAL_COMMUNITY): Payer: Self-pay

## 2024-09-26 ENCOUNTER — Emergency Department (HOSPITAL_COMMUNITY)

## 2024-09-26 ENCOUNTER — Inpatient Hospital Stay (HOSPITAL_COMMUNITY)
Admission: EM | Admit: 2024-09-26 | Discharge: 2024-10-02 | DRG: 872 | Disposition: A | Discharge status: Home Health | Attending: Internal Medicine | Admitting: Internal Medicine

## 2024-09-26 ENCOUNTER — Other Ambulatory Visit: Payer: Self-pay

## 2024-09-26 DIAGNOSIS — Z7984 Long term (current) use of oral hypoglycemic drugs: Secondary | ICD-10-CM

## 2024-09-26 DIAGNOSIS — N132 Hydronephrosis with renal and ureteral calculous obstruction: Secondary | ICD-10-CM | POA: Diagnosis not present

## 2024-09-26 DIAGNOSIS — Z87440 Personal history of urinary (tract) infections: Secondary | ICD-10-CM

## 2024-09-26 DIAGNOSIS — Z87442 Personal history of urinary calculi: Secondary | ICD-10-CM

## 2024-09-26 DIAGNOSIS — D75839 Thrombocytosis, unspecified: Secondary | ICD-10-CM | POA: Diagnosis present

## 2024-09-26 DIAGNOSIS — E875 Hyperkalemia: Secondary | ICD-10-CM | POA: Diagnosis present

## 2024-09-26 DIAGNOSIS — H9190 Unspecified hearing loss, unspecified ear: Secondary | ICD-10-CM | POA: Diagnosis present

## 2024-09-26 DIAGNOSIS — K429 Umbilical hernia without obstruction or gangrene: Secondary | ICD-10-CM | POA: Diagnosis not present

## 2024-09-26 DIAGNOSIS — N130 Hydronephrosis with ureteropelvic junction obstruction: Secondary | ICD-10-CM | POA: Diagnosis not present

## 2024-09-26 DIAGNOSIS — Z961 Presence of intraocular lens: Secondary | ICD-10-CM | POA: Diagnosis present

## 2024-09-26 DIAGNOSIS — Z87891 Personal history of nicotine dependence: Secondary | ICD-10-CM

## 2024-09-26 DIAGNOSIS — E785 Hyperlipidemia, unspecified: Secondary | ICD-10-CM | POA: Diagnosis present

## 2024-09-26 DIAGNOSIS — D631 Anemia in chronic kidney disease: Secondary | ICD-10-CM | POA: Diagnosis present

## 2024-09-26 DIAGNOSIS — A419 Sepsis, unspecified organism: Principal | ICD-10-CM | POA: Diagnosis present

## 2024-09-26 DIAGNOSIS — E871 Hypo-osmolality and hyponatremia: Secondary | ICD-10-CM | POA: Diagnosis present

## 2024-09-26 DIAGNOSIS — N2889 Other specified disorders of kidney and ureter: Secondary | ICD-10-CM | POA: Diagnosis not present

## 2024-09-26 DIAGNOSIS — E1122 Type 2 diabetes mellitus with diabetic chronic kidney disease: Secondary | ICD-10-CM | POA: Diagnosis present

## 2024-09-26 DIAGNOSIS — N390 Urinary tract infection, site not specified: Secondary | ICD-10-CM

## 2024-09-26 DIAGNOSIS — I129 Hypertensive chronic kidney disease with stage 1 through stage 4 chronic kidney disease, or unspecified chronic kidney disease: Secondary | ICD-10-CM | POA: Diagnosis present

## 2024-09-26 DIAGNOSIS — Z9842 Cataract extraction status, left eye: Secondary | ICD-10-CM

## 2024-09-26 DIAGNOSIS — Z79899 Other long term (current) drug therapy: Secondary | ICD-10-CM

## 2024-09-26 DIAGNOSIS — E872 Acidosis, unspecified: Secondary | ICD-10-CM | POA: Diagnosis present

## 2024-09-26 DIAGNOSIS — E876 Hypokalemia: Secondary | ICD-10-CM | POA: Diagnosis present

## 2024-09-26 DIAGNOSIS — N3021 Other chronic cystitis with hematuria: Secondary | ICD-10-CM | POA: Diagnosis not present

## 2024-09-26 DIAGNOSIS — N1831 Chronic kidney disease, stage 3a: Secondary | ICD-10-CM | POA: Diagnosis present

## 2024-09-26 DIAGNOSIS — N136 Pyonephrosis: Secondary | ICD-10-CM | POA: Diagnosis present

## 2024-09-26 DIAGNOSIS — M419 Scoliosis, unspecified: Secondary | ICD-10-CM | POA: Diagnosis present

## 2024-09-26 DIAGNOSIS — Z90710 Acquired absence of both cervix and uterus: Secondary | ICD-10-CM

## 2024-09-26 DIAGNOSIS — I10 Essential (primary) hypertension: Secondary | ICD-10-CM | POA: Diagnosis not present

## 2024-09-26 DIAGNOSIS — Z883 Allergy status to other anti-infective agents status: Secondary | ICD-10-CM

## 2024-09-26 DIAGNOSIS — Z9841 Cataract extraction status, right eye: Secondary | ICD-10-CM

## 2024-09-26 DIAGNOSIS — M797 Fibromyalgia: Secondary | ICD-10-CM | POA: Diagnosis present

## 2024-09-26 DIAGNOSIS — N179 Acute kidney failure, unspecified: Principal | ICD-10-CM | POA: Diagnosis present

## 2024-09-26 DIAGNOSIS — E119 Type 2 diabetes mellitus without complications: Secondary | ICD-10-CM

## 2024-09-26 DIAGNOSIS — N281 Cyst of kidney, acquired: Secondary | ICD-10-CM | POA: Diagnosis present

## 2024-09-26 DIAGNOSIS — Z860101 Personal history of adenomatous and serrated colon polyps: Secondary | ICD-10-CM

## 2024-09-26 LAB — CBC WITH DIFFERENTIAL/PLATELET
Abs Immature Granulocytes: 0.08 K/uL — ABNORMAL HIGH (ref 0.00–0.07)
Basophils Absolute: 0.1 K/uL (ref 0.0–0.1)
Basophils Relative: 0 %
Eosinophils Absolute: 0.1 K/uL (ref 0.0–0.5)
Eosinophils Relative: 1 %
HCT: 32 % — ABNORMAL LOW (ref 36.0–46.0)
Hemoglobin: 10.5 g/dL — ABNORMAL LOW (ref 12.0–15.0)
Immature Granulocytes: 1 %
Lymphocytes Relative: 29 %
Lymphs Abs: 3.4 K/uL (ref 0.7–4.0)
MCH: 27.9 pg (ref 26.0–34.0)
MCHC: 32.8 g/dL (ref 30.0–36.0)
MCV: 84.9 fL (ref 80.0–100.0)
Monocytes Absolute: 1.2 K/uL — ABNORMAL HIGH (ref 0.1–1.0)
Monocytes Relative: 11 %
Neutro Abs: 6.6 K/uL (ref 1.7–7.7)
Neutrophils Relative %: 58 %
Platelets: 436 K/uL — ABNORMAL HIGH (ref 150–400)
RBC: 3.77 MIL/uL — ABNORMAL LOW (ref 3.87–5.11)
RDW: 18 % — ABNORMAL HIGH (ref 11.5–15.5)
WBC: 11.4 K/uL — ABNORMAL HIGH (ref 4.0–10.5)
nRBC: 0 % (ref 0.0–0.2)

## 2024-09-26 LAB — COMPREHENSIVE METABOLIC PANEL WITH GFR
ALT: 20 U/L (ref 0–44)
AST: 30 U/L (ref 15–41)
Albumin: 3.7 g/dL (ref 3.5–5.0)
Alkaline Phosphatase: 72 U/L (ref 38–126)
Anion gap: 17 — ABNORMAL HIGH (ref 5–15)
BUN: 114 mg/dL — ABNORMAL HIGH (ref 8–23)
CO2: 14 mmol/L — ABNORMAL LOW (ref 22–32)
Calcium: 10 mg/dL (ref 8.9–10.3)
Chloride: 102 mmol/L (ref 98–111)
Creatinine, Ser: 5.6 mg/dL — ABNORMAL HIGH (ref 0.44–1.00)
GFR, Estimated: 7 mL/min — ABNORMAL LOW (ref >60)
Glucose, Bld: 108 mg/dL — ABNORMAL HIGH (ref 70–99)
Potassium: 5.7 mmol/L — ABNORMAL HIGH (ref 3.5–5.1)
Sodium: 134 mmol/L — ABNORMAL LOW (ref 135–145)
Total Bilirubin: 0.3 mg/dL (ref 0.0–1.2)
Total Protein: 7.4 g/dL (ref 6.5–8.1)

## 2024-09-26 LAB — URINALYSIS, ROUTINE W REFLEX MICROSCOPIC
Bilirubin Urine: NEGATIVE
Glucose, UA: NEGATIVE mg/dL
Ketones, ur: NEGATIVE mg/dL
Nitrite: NEGATIVE
Protein, ur: 100 mg/dL — AB
RBC / HPF: >50 RBC/hpf (ref 0–5)
Specific Gravity, Urine: 1.013 (ref 1.005–1.030)
WBC, UA: >50 WBC/hpf (ref 0–5)
pH: 5 (ref 5.0–8.0)

## 2024-09-26 MED ORDER — SODIUM BICARBONATE 8.4 % IV SOLN
25.0000 meq | Freq: Once | INTRAVENOUS | Status: AC
  Administered 2024-09-27: 25 meq via INTRAVENOUS
  Filled 2024-09-26: qty 50

## 2024-09-26 MED ORDER — SODIUM CHLORIDE 0.9 % IV SOLN
1.0000 g | Freq: Once | INTRAVENOUS | Status: AC
  Administered 2024-09-27: 1 g via INTRAVENOUS
  Filled 2024-09-26: qty 10

## 2024-09-26 MED ORDER — ALBUTEROL SULFATE (2.5 MG/3ML) 0.083% IN NEBU
2.5000 mg | INHALATION_SOLUTION | Freq: Once | RESPIRATORY_TRACT | Status: AC
  Administered 2024-09-27: 2.5 mg via RESPIRATORY_TRACT
  Filled 2024-09-26: qty 3

## 2024-09-26 MED ORDER — SODIUM ZIRCONIUM CYCLOSILICATE 5 G PO PACK
5.0000 g | PACK | Freq: Once | ORAL | Status: AC
  Administered 2024-09-27: 5 g via ORAL
  Filled 2024-09-26: qty 1

## 2024-09-26 MED ORDER — CALCIUM GLUCONATE 10 % IV SOLN
1.0000 g | Freq: Once | INTRAVENOUS | Status: AC
  Administered 2024-09-27: 1 g via INTRAVENOUS
  Filled 2024-09-26: qty 10

## 2024-09-26 MED ORDER — LACTATED RINGERS IV BOLUS
500.0000 mL | Freq: Once | INTRAVENOUS | Status: AC
  Administered 2024-09-27: 500 mL via INTRAVENOUS

## 2024-09-26 NOTE — H&P (Incomplete)
 History and Physical    Ashley Dean FMW:[id number] DOB: 06-16-1941 DOA: 09/26/2024  DOS: the patient was seen and examined on 09/26/2024  PCP: [name], MD   Patient coming from: Home  I have personally briefly reviewed patient's old medical records in [facility] and CareEverywhere  HPI:   Ashley Dean is a 83 y.o. year old female with medical history of hypertension, hyperlipidemia, type 2 diabetes,, recurrent UTI, CKD 3A presenting to the ED with dysuria and inability to void.   Pt states   On arrival to the ED patient was noted to be HDS stable.  Lab work and imaging obtained.  CBC with leukocytosis, mild anemia that is normocytic and thrombocytosis.  CMP with mild hyperkalemia, anion gap metabolic acidosis and significant AKI with creatinine at 5.6.  UA obtained and shows UTI.  CT abdomen pelvis without contrast obtained that shows bilateral urothelial thickening with periureteric stranding and mild hydronephrosis and dilated renal pelvis with concern for ascending UTI.  It also shows circumferential bladder wall thickening concerning for inflammatory cystitis versus infectious cystitis.  It also showed 14 mm hyperdense right renal cyst which is consistent with a benign cyst.  Given her presentation of AKI, [facility] contacted for admission.  Review of Systems: As mentioned in the history of present illness. All other systems reviewed and are negative.   Past Medical History:  Diagnosis Date   Arthritis    back   Depression    Diverticulosis of colon    Fibromyalgia    Headache    History of adenomatous polyp of colon    09-20-2004   History of kidney stones    History of pelvic mass    12-04-2015  s/p  lap. 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   History of urinary retention    hx post-op retention x 1 yrs ago none since   Hyperlipidemia    Hypertension    Left ureteral stone    Macular degeneration    Osteopenia    Type 2  diabetes mellitus (HCC)    Wears dentures    upper   Wears glasses     Past Surgical History:  Procedure Laterality Date   ANKLE SURGERY Bilateral    yrs ago   CATARACT EXTRACTION W/ INTRAOCULAR LENS  IMPLANT, BILATERAL  1992   CYSTOSCOPY W/ URETERAL STENT PLACEMENT Right 04/16/2022   Procedure: CYSTOSCOPY WITH RIGHT RETROGRADE PYELOGRAM/ right ureteroscopy/  URETERAL STENT PLACEMENT;  Surgeon: Devere Lonni Righter, MD;  Location: [facility];  Service: Urology;  Laterality: Right;   CYSTOSCOPY WITH URETEROSCOPY AND STENT PLACEMENT Left 05/19/2017   Procedure: CYSTOSCOPY WITH URETEROSCOPY/ STONE EXTRACTION;  Surgeon: Watt Rush, MD;  Location: [facility];  Service: Urology;  Laterality: Left;   CYSTOSCOPY/URETEROSCOPY/HOLMIUM LASER/STENT PLACEMENT Right 03/16/2024   Procedure: CYSTOSCOPY/URETEROSCOPY/STENT PLACEMENT/RETROGRADE;  Surgeon: Devere Lonni Righter, MD;  Location: [facility];  Service: Urology;  Laterality: Right;  CYSTOSCOPY/RIGHT RETROGRADE PYELOGRAM/URETEROSCOPY/POSSIBLE HOLMIUM LASER/POSSIBLE STENT PLACEMENT   EXTRACORPOREAL SHOCK WAVE LITHOTRIPSY Left last one 11/08/2015   previously x2   LAPAROSCOPIC CHOLECYSTECTOMY     04-09-2005   dr toth   LAPAROSCOPY W/ BILATERAL SALPINGOOPHORECTOMY   12-04-2015   at [facility])   NEPHROLITHOTOMY  1978   ORIF RIGHT ANKLE FX  11/09/2008   TONSILLECTOMY  child   VAGINAL HYSTERECTOMY  1978     Allergies  Allergen Reactions   Macrodantin [Nitrofurantoin Macrocrystal] Rash    History  reviewed. No pertinent family history.  Prior to Admission medications   Medication Sig Start Date End Date Taking? Authorizing Provider  acetaminophen  (TYLENOL ) 650 MG CR tablet Take 650 mg by mouth daily as needed for pain.    [provider]  Ascorbic Acid (VITAMIN C PO) Take 1 tablet by mouth daily.    [provider]  atenolol (TENORMIN) 50 MG tablet Take 50 mg by mouth every morning.      [provider]  beta carotene w/minerals (OCUVITE) tablet Take 1 tablet by mouth daily. Capsule form     [provider]  CALCIUM PO Take 1 tablet by mouth in the morning and at bedtime.    [provider]  DULoxetine (CYMBALTA) 30 MG capsule Take 90 mg by mouth at bedtime.    [provider]  empagliflozin (JARDIANCE) 25 MG TABS tablet Take 25 mg by mouth at bedtime.    [provider]  icosapent Ethyl (VASCEPA) 1 g capsule Take 1 g by mouth 2 (two) times daily.    [provider]  lisinopril-hydrochlorothiazide (PRINZIDE,ZESTORETIC) 20-25 MG tablet Take 1 tablet by mouth every morning.    [provider]  LORazepam (ATIVAN) 0.5 MG tablet Take 0.5 mg by mouth at bedtime as needed for anxiety or sleep.    [provider]  metFORMIN (GLUCOPHAGE) 500 MG tablet Take 1,000 mg by mouth 2 (two) times daily with a meal. Patient not taking: Reported on 03/11/2024    [provider]  oxybutynin  (DITROPAN ) 5 MG tablet Take 1 tablet (5 mg total) by mouth every 8 (eight) hours as needed for bladder spasms. 03/16/24   Devere Lonni Righter, MD  oxyCODONE -acetaminophen  (PERCOCET) 10-325 MG tablet Take 1 tablet by mouth 3 (three) times daily as needed for pain. 02/13/24   [provider]  phenazopyridine  (PYRIDIUM ) 200 MG tablet Take 1 tablet (200 mg total) by mouth 3 (three) times daily for 2 days 08/17/24     rosuvastatin (CRESTOR) 10 MG tablet Take 10 mg by mouth at bedtime.    [provider]  Semaglutide (RYBELSUS) 7 MG TABS Take 7 mg by mouth daily.    [provider]  sulfamethoxazole-trimethoprim (BACTRIM DS) 800-160 MG tablet Take 1 tablet by mouth 2 (two) times daily for 7 days 08/17/24     VITAMIN D PO Take 1 capsule by mouth daily.    [provider]    Social History:  reports that she quit smoking about 21 years ago. Her smoking use included cigarettes. She started smoking about 41  years ago. She has a 30 pack-year smoking history. She has never been exposed to tobacco smoke. She has never used smokeless tobacco. She reports that she does not drink alcohol and does not use drugs. Lives with Tobacco- *** ppd x **** years since age/ Denies use. EtOH- *** shots/ week, *** beers/week. Last drink was ***. Denies use.  Illicit drug use- denies use.  IADLs/ADLs- can perform independently at baseline    Physical Exam: Vitals:   09/26/24 1957 09/26/24 2001 09/26/24 2313  BP:  (!) 108/51 109/86  Pulse: 73 73 77  Resp:  16 17  Temp:  97.7 F (36.5 C) 98 F (36.7 C)  TempSrc:  Oral   SpO2: (!) 83% 91% 96%     Gen: HENT: CV: Resp: Abd: MSK: Skin: Neuro: Psych:   Labs on Admission: I have personally reviewed following labs and imaging studies  CBC: Recent Labs  Lab 09/26/24  2046  WBC 11.4*  NEUTROABS 6.6  HGB 10.5*  HCT 32.0*  MCV 84.9  PLT 436*   Basic Metabolic Panel: Recent Labs  Lab 09/26/24 2046  NA 134*  K 5.7*  CL 102  CO2 14*  GLUCOSE 108*  BUN 114*  CREATININE 5.60*  CALCIUM 10.0   GFR: CrCl cannot be calculated (Unknown ideal weight.). Liver Function Tests: Recent Labs  Lab 09/26/24 2046  AST 30  ALT 20  ALKPHOS 72  BILITOT 0.3  PROT 7.4  ALBUMIN 3.7   No results for input(s): LIPASE, AMYLASE in the last 168 hours. No results for input(s): AMMONIA in the last 168 hours. Coagulation Profile: No results for input(s): INR, PROTIME in the last 168 hours. Cardiac Enzymes: No results for input(s): CKTOTAL, CKMB, CKMBINDEX, TROPONINI, TROPONINIHS in the last 168 hours. BNP (last 3 results) No results for input(s): BNP in the last 8760 hours. HbA1C: No results for input(s): HGBA1C in the last 72 hours. CBG: No results for input(s): GLUCAP in the last 168 hours. Lipid Profile: No results for input(s): CHOL, HDL, LDLCALC, TRIG, CHOLHDL, LDLDIRECT in the last 72 hours. Thyroid  Function  Tests: No results for input(s): TSH, T4TOTAL, FREET4, T3FREE, THYROIDAB in the last 72 hours. Anemia Panel: No results for input(s): VITAMINB12, FOLATE, FERRITIN, TIBC, IRON, RETICCTPCT in the last 72 hours. Urine analysis:    Component Value Date/Time   COLORURINE YELLOW 09/26/2024 1934   APPEARANCEUR TURBID (A) 09/26/2024 1934   LABSPEC 1.013 09/26/2024 1934   PHURINE 5.0 09/26/2024 1934   GLUCOSEU NEGATIVE 09/26/2024 1934   HGBUR LARGE (A) 09/26/2024 1934   BILIRUBINUR NEGATIVE 09/26/2024 1934   KETONESUR NEGATIVE 09/26/2024 1934   PROTEINUR 100 (A) 09/26/2024 1934   NITRITE NEGATIVE 09/26/2024 1934   LEUKOCYTESUR LARGE (A) 09/26/2024 1934    Radiological Exams on Admission: I have personally reviewed images CT ABDOMEN PELVIS WO CONTRAST Result Date: 09/26/2024 EXAM: CT ABDOMEN AND PELVIS WITHOUT CONTRAST 09/26/2024 09:42:35 PM TECHNIQUE: CT of the abdomen and pelvis was performed without the administration of intravenous contrast. Multiplanar reformatted images are provided for review. Automated exposure control, iterative reconstruction, and/or weight-based adjustment of the mA/kV was utilized to reduce the radiation dose to as low as reasonably achievable. COMPARISON: 07/08/2024 CLINICAL HISTORY: Pain with urination, abdominal pain, urinary retention. FINDINGS: LOWER CHEST: No acute abnormality. LIVER: The liver is unremarkable. GALLBLADDER AND BILE DUCTS: Status post cholecystectomy. No biliary ductal dilatation. SPLEEN: No acute abnormality. PANCREAS: No acute abnormality. ADRENAL GLANDS: No acute abnormality. KIDNEYS, URETERS AND BLADDER: Stable at 14 mm hyperdense exophytic lesion arises from the interval region of the right kidney laterally since remote prior examination of 06/04/2021 which is in keeping with a hyperdense cyst. No follow up imaging is recommended. Multiple nonobstructive 1 - 2 mm punctate renal calculi are again identified bilaterally,  unchanged from prior examination. There is interval development of bilateral urothelial thickening and periureteric inflammatory stranding as well as development of mild hydronephrosis and progressive distention of the dilated right renal pelvis which may reflect changes of bilateral ascending urinary tract infection. There is progressive circumferential bladder wall thickening and subtle periarticular inflammatory stranding suggesting an underlying infectious or inflammatory cystitis. Correlation with urinalysis and urine culture may be helpful for further management. No ureteral calculi. No perinephric inflammatory stranding or fluid collection identified. GI AND BOWEL: Appendix normal. The stomach, small bowel, and large bowel are otherwise unremarkable. PERITONEUM AND RETROPERITONEUM: No ascites. No free air. VASCULATURE: Aorta is normal in caliber. Moderate  aortoiliac atherosclerotic calcification. No aortic aneurysm. LYMPH NODES: No lymphadenopathy. REPRODUCTIVE ORGANS: Uterus absent. No adnexal masses. BONES AND SOFT TISSUES: Osseous structures are age appropriate. No acute bone abnormality. No lytic or blastic bone lesion. Small broad-based fat-containing umbilical hernia. No focal soft tissue abnormality. IMPRESSION: 1. Interval development of bilateral urothelial thickening with periureteric inflammatory stranding, mild hydronephrosis, and progressive distention of the dilated right renal pelvis, suggestive of bilateral ascending urinary tract infection; no ureteral calculi . no perinephric inflammatory stranding or fluid collection identified 2. Progressive circumferential bladder wall thickening with subtle perivesical inflammatory stranding, suggestive of infectious or inflammatory cystitis; correlation with urinalysis and urine culture may be helpful for further management 3. Stable 14 mm hyperdense exophytic right renal cyst, compatible with benign hyperdense cyst; no follow-up imaging recommended 4.  Multiple bilateral nonobstructive punctate renal calculi, unchanged 5. raf score: Aortic atherosclerosis (icd10-i70.0) Electronically signed by: Dorethia Molt MD 09/26/2024 10:32 PM EST RP Workstation: [id number]    EKG: My personal interpretation of EKG shows: Pending    Assessment/Plan Active Problems:   * No active hospital problems. *     VTE prophylaxis:  SQ Heparin  Diet: Renal Code Status:  Full Code Telemetry:  Admission status: Inpatient, Telemetry bed Patient is from: Home Anticipated d/c is to: Home Anticipated d/c is in: 2-3 days   Family Communication: Updated at bedside  Consults called: None   Severity of Illness: The appropriate patient status for this patient is INPATIENT. Inpatient status is judged to be reasonable and necessary in order to provide the required intensity of service to ensure the patient's safety. The patient's presenting symptoms, physical exam findings, and initial radiographic and laboratory data in the context of their chronic comorbidities is felt to place them at high risk for further clinical deterioration. Furthermore, it is not anticipated that the patient will be medically stable for discharge from the hospital within 2 midnights of admission.   * I certify that at the point of admission it is my clinical judgment that the patient will require inpatient hospital care spanning beyond 2 midnights from the point of admission due to high intensity of service, high risk for further deterioration and high frequency of surveillance required.DEWAINE Morene Bathe, MD [facility]. [facility]

## 2024-09-26 NOTE — ED Provider Notes (Signed)
 [facility] Provider Note   CSN: [id number] Arrival date & time: 09/26/24  8087     Patient presents with: Dysuria   Ashley Dean is a 83 y.o. female.   83 year old female presents for evaluation of dysuria.  States has been going on for 5 months.  States has been diagnosed with frequent UTIs and is still on Cipro .  States she was at her urologist office today and he told her he did not know what was causing her symptoms.  Patient states she feels a lot of fullness in her bladder sometimes has difficulty urinating but then gets a sharp pain and is able to urinate.  She denies any other symptoms or concerns at this time.   Dysuria Associated symptoms: no abdominal pain, no fever and no vomiting        Prior to Admission medications   Medication Sig Start Date End Date Taking? Authorizing Provider  acetaminophen  (TYLENOL ) 650 MG CR tablet Take 650 mg by mouth daily as needed for pain.    [provider]  Ascorbic Acid (VITAMIN C PO) Take 1 tablet by mouth daily.    [provider]  atenolol (TENORMIN) 50 MG tablet Take 50 mg by mouth every morning.     [provider]  beta carotene w/minerals (OCUVITE) tablet Take 1 tablet by mouth daily. Capsule form     [provider]  CALCIUM PO Take 1 tablet by mouth in the morning and at bedtime.    [provider]  DULoxetine (CYMBALTA) 30 MG capsule Take 90 mg by mouth at bedtime.    [provider]  empagliflozin (JARDIANCE) 25 MG TABS tablet Take 25 mg by mouth at bedtime.    [provider]  icosapent Ethyl (VASCEPA) 1 g capsule Take 1 g by mouth 2 (two) times daily.    [provider]  lisinopril-hydrochlorothiazide (PRINZIDE,ZESTORETIC) 20-25 MG tablet Take 1 tablet by mouth every morning.    [provider]  LORazepam (ATIVAN) 0.5 MG tablet Take 0.5 mg by mouth at bedtime as needed for anxiety or sleep.     [provider]  metFORMIN (GLUCOPHAGE) 500 MG tablet Take 1,000 mg by mouth 2 (two) times daily with a meal. Patient not taking: Reported on 03/11/2024    [provider]  oxybutynin  (DITROPAN ) 5 MG tablet Take 1 tablet (5 mg total) by mouth every 8 (eight) hours as needed for bladder spasms. 03/16/24   Devere Lonni Righter, MD  oxyCODONE -acetaminophen  (PERCOCET) 10-325 MG tablet Take 1 tablet by mouth 3 (three) times daily as needed for pain. 02/13/24   [provider]  phenazopyridine  (PYRIDIUM ) 200 MG tablet Take 1 tablet (200 mg total) by mouth 3 (three) times daily for 2 days 08/17/24     rosuvastatin (CRESTOR) 10 MG tablet Take 10 mg by mouth at bedtime.    [provider]  Semaglutide (RYBELSUS) 7 MG TABS Take 7 mg by mouth daily.    [provider]  sulfamethoxazole-trimethoprim (BACTRIM DS) 800-160 MG tablet Take 1 tablet by mouth 2 (two) times daily for 7 days 08/17/24     VITAMIN D PO Take 1 capsule by mouth daily.    [provider]    Allergies: Macrodantin [nitrofurantoin macrocrystal]    Review of Systems  Constitutional:  Negative for chills and fever.  HENT:  Negative for ear pain and sore throat.   Eyes:  Negative for pain and visual disturbance.  Respiratory:  Negative for cough and shortness of breath.   Cardiovascular:  Negative for chest pain and palpitations.  Gastrointestinal:  Negative for abdominal pain and vomiting.  Genitourinary:  Positive for dysuria. Negative for hematuria.  Musculoskeletal:  Negative for arthralgias and back pain.  Skin:  Negative for color change and rash.  Neurological:  Negative for seizures and syncope.  All other systems reviewed and are negative.   Updated Vital Signs BP 109/86   Pulse 77   Temp 98 F (36.7 C)   Resp 17   SpO2 96%   Physical Exam Vitals and nursing note reviewed.  Constitutional:      General: She is not in acute distress.    Appearance: Normal  appearance. She is well-developed. She is not ill-appearing.  HENT:     Head: Normocephalic and atraumatic.  Eyes:     Conjunctiva/sclera: Conjunctivae normal.  Cardiovascular:     Rate and Rhythm: Normal rate and regular rhythm.     Heart sounds: No murmur heard. Pulmonary:     Effort: Pulmonary effort is normal. No respiratory distress.     Breath sounds: Normal breath sounds.  Abdominal:     Palpations: Abdomen is soft.     Tenderness: There is no abdominal tenderness.  Musculoskeletal:        General: No swelling.     Cervical back: Neck supple.  Skin:    General: Skin is warm and dry.     Capillary Refill: Capillary refill takes less than 2 seconds.  Neurological:     Mental Status: She is alert.  Psychiatric:        Mood and Affect: Mood normal.     (all labs ordered are listed, but only abnormal results are displayed) Labs Reviewed  URINALYSIS, ROUTINE W REFLEX MICROSCOPIC - Abnormal; Notable for the following components:      Result Value   APPearance TURBID (*)    Hgb urine dipstick LARGE (*)    Protein, ur 100 (*)    Leukocytes,Ua LARGE (*)    Bacteria, UA FEW (*)    All other components within normal limits  CBC WITH DIFFERENTIAL/PLATELET - Abnormal; Notable for the following components:   WBC 11.4 (*)    RBC 3.77 (*)    Hemoglobin 10.5 (*)    HCT 32.0 (*)    RDW 18.0 (*)    Platelets 436 (*)    Monocytes Absolute 1.2 (*)    Abs Immature Granulocytes 0.08 (*)    All other components within normal limits  COMPREHENSIVE METABOLIC PANEL WITH GFR - Abnormal; Notable for the following components:   Sodium 134 (*)    Potassium 5.7 (*)    CO2 14 (*)    Glucose, Bld 108 (*)    BUN 114 (*)    Creatinine, Ser 5.60 (*)    GFR, Estimated 7 (*)    Anion gap 17 (*)    All other components within normal limits  URINE CULTURE    EKG: None  Radiology: CT ABDOMEN PELVIS WO CONTRAST Result Date: 09/26/2024 EXAM: CT ABDOMEN AND PELVIS WITHOUT CONTRAST  09/26/2024 09:42:35 PM TECHNIQUE: CT of the abdomen and pelvis was performed without the administration of intravenous contrast. Multiplanar reformatted images are provided for review. Automated exposure control, iterative reconstruction, and/or weight-based adjustment of the mA/kV was utilized to reduce the radiation dose to as low as reasonably achievable. COMPARISON: 07/08/2024 CLINICAL HISTORY: Pain with urination, abdominal pain, urinary retention. FINDINGS: LOWER CHEST: No acute  abnormality. LIVER: The liver is unremarkable. GALLBLADDER AND BILE DUCTS: Status post cholecystectomy. No biliary ductal dilatation. SPLEEN: No acute abnormality. PANCREAS: No acute abnormality. ADRENAL GLANDS: No acute abnormality. KIDNEYS, URETERS AND BLADDER: Stable at 14 mm hyperdense exophytic lesion arises from the interval region of the right kidney laterally since remote prior examination of 06/04/2021 which is in keeping with a hyperdense cyst. No follow up imaging is recommended. Multiple nonobstructive 1 - 2 mm punctate renal calculi are again identified bilaterally, unchanged from prior examination. There is interval development of bilateral urothelial thickening and periureteric inflammatory stranding as well as development of mild hydronephrosis and progressive distention of the dilated right renal pelvis which may reflect changes of bilateral ascending urinary tract infection. There is progressive circumferential bladder wall thickening and subtle periarticular inflammatory stranding suggesting an underlying infectious or inflammatory cystitis. Correlation with urinalysis and urine culture may be helpful for further management. No ureteral calculi. No perinephric inflammatory stranding or fluid collection identified. GI AND BOWEL: Appendix normal. The stomach, small bowel, and large bowel are otherwise unremarkable. PERITONEUM AND RETROPERITONEUM: No ascites. No free air. VASCULATURE: Aorta is normal in caliber.  Moderate aortoiliac atherosclerotic calcification. No aortic aneurysm. LYMPH NODES: No lymphadenopathy. REPRODUCTIVE ORGANS: Uterus absent. No adnexal masses. BONES AND SOFT TISSUES: Osseous structures are age appropriate. No acute bone abnormality. No lytic or blastic bone lesion. Small broad-based fat-containing umbilical hernia. No focal soft tissue abnormality. IMPRESSION: 1. Interval development of bilateral urothelial thickening with periureteric inflammatory stranding, mild hydronephrosis, and progressive distention of the dilated right renal pelvis, suggestive of bilateral ascending urinary tract infection; no ureteral calculi . no perinephric inflammatory stranding or fluid collection identified 2. Progressive circumferential bladder wall thickening with subtle perivesical inflammatory stranding, suggestive of infectious or inflammatory cystitis; correlation with urinalysis and urine culture may be helpful for further management 3. Stable 14 mm hyperdense exophytic right renal cyst, compatible with benign hyperdense cyst; no follow-up imaging recommended 4. Multiple bilateral nonobstructive punctate renal calculi, unchanged 5. raf score: Aortic atherosclerosis (icd10-i70.0) Electronically signed by: Dorethia Molt MD 09/26/2024 10:32 PM EST RP Workstation: [id number]     .Critical Care  Performed by: Gennaro Duwaine CROME, DO Authorized by: Gennaro Duwaine CROME, DO   Critical care provider statement:    Critical care time (minutes):  35   Critical care time was exclusive of:  Separately billable procedures and treating other patients and teaching time   Critical care was necessary to treat or prevent imminent or life-threatening deterioration of the following conditions:  Renal failure and endocrine crisis   Critical care was time spent personally by me on the following activities:  Development of treatment plan with patient or surrogate, discussions with consultants, evaluation of patient's response to  treatment, examination of patient, obtaining history from patient or surrogate, review of old charts, re-evaluation of patient's condition, ordering and review of radiographic studies, ordering and review of laboratory studies and ordering and performing treatments and interventions   Care discussed with: admitting provider      Medications Ordered in the ED  lactated ringers  bolus 500 mL (has no administration in time range)  cefTRIAXone  (ROCEPHIN ) 1 g in sodium chloride  0.9 % 100 mL IVPB (has no administration in time range)  sodium bicarbonate injection 25 mEq (has no administration in time range)  albuterol (PROVENTIL) (2.5 MG/3ML) 0.083% nebulizer solution 2.5 mg (has no administration in time range)  sodium zirconium cyclosilicate (LOKELMA) packet 5 g (has no administration in time range)  calcium gluconate  inj 10% (1 g) URGENT USE ONLY! (has no administration in time range)                                    Medical Decision Making Cardiac monitor interpretation: Sinus rhythm, no ectopy  Patient here for dysuria and some urinary retention.  Found on CT scan to have hydronephrosis as well as inflammation around her bladder and bilateral ureters.  She has been on ciprofloxacin  without improvement.  Will give her Rocephin  and IV fluids here.  Found to have acute renal failure as well as hyperkalemia.  Was given calcium gluconate, Lokelma, sodium bicarb, and albuterol for her hyperkalemia.  Will place over the catheter.  Patient's case discussed with hospitalist, patient will be admitted for further workup and manage.  All results and plan discussed with patient and family at bedside and they agreeable with the plan  Problems Addressed: Acute renal failure, unspecified acute renal failure type: acute illness or injury that poses a threat to life or bodily functions Hyperkalemia: acute illness or injury that poses a threat to life or bodily functions Urinary tract infection without  hematuria, site unspecified: acute illness or injury  Amount and/or Complexity of Data Reviewed External Data Reviewed: notes.    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 Decision-making details documented in ED Course.    Details: Ordered and reviewed by me patient had acute renal failure as well as hyperkalemia and still with evidence of UTI Radiology: ordered and independent interpretation performed. Decision-making details documented in ED Course.    Details: Ordered and reviewed by me CT abdomen pelvis: Shows evidence of hydronephrosis and bilateral ureteral and bladder inflammation ECG/medicine tests: ordered.    Details: Ordered and pending Discussion of management or test interpretation with external provider(s): Hospitalist - I spoke  with him regarding the patient's case, patient will be admitted for further workup and management  Risk OTC drugs. Prescription drug management. Drug therapy requiring intensive monitoring for toxicity. Decision regarding hospitalization. Diagnosis or treatment significantly limited by social determinants of health. Risk Details: CRITICAL CARE Performed by: Duwaine LITTIE Fusi   Total critical care time: 35 minutes  Critical care time was exclusive of separately billable procedures and treating other patients.  Critical care was necessary to treat or prevent imminent or life-threatening deterioration.  Critical care was time spent personally by me on the following activities: development of treatment plan with patient and/or surrogate as well as nursing, discussions with consultants, evaluation of patient's response to treatment, examination of patient, obtaining history from patient or surrogate, ordering and performing treatments and interventions, ordering and review of laboratory studies, ordering and review of radiographic studies, pulse oximetry and re-evaluation of  patient's condition.   Critical Care Total time providing critical care: 35 minutes    Final diagnoses:  Acute renal failure, unspecified acute renal failure type  Urinary tract infection without hematuria, site unspecified  Hyperkalemia    ED Discharge Orders     None          Fusi Duwaine LITTIE, DO 09/26/24 2339

## 2024-09-26 NOTE — ED Triage Notes (Signed)
 Pt BIB EMS from home with reports of dysuria and unable to urinate. Pt reports that her urine is black today. This has been going on for 6 months.

## 2024-09-27 DIAGNOSIS — E785 Hyperlipidemia, unspecified: Secondary | ICD-10-CM | POA: Insufficient documentation

## 2024-09-27 DIAGNOSIS — N130 Hydronephrosis with ureteropelvic junction obstruction: Secondary | ICD-10-CM | POA: Diagnosis not present

## 2024-09-27 DIAGNOSIS — N390 Urinary tract infection, site not specified: Secondary | ICD-10-CM | POA: Diagnosis not present

## 2024-09-27 DIAGNOSIS — N179 Acute kidney failure, unspecified: Secondary | ICD-10-CM | POA: Diagnosis not present

## 2024-09-27 DIAGNOSIS — I10 Essential (primary) hypertension: Secondary | ICD-10-CM | POA: Insufficient documentation

## 2024-09-27 DIAGNOSIS — N1831 Chronic kidney disease, stage 3a: Secondary | ICD-10-CM | POA: Diagnosis not present

## 2024-09-27 DIAGNOSIS — E119 Type 2 diabetes mellitus without complications: Secondary | ICD-10-CM

## 2024-09-27 LAB — CBC
HCT: 27.5 % — ABNORMAL LOW (ref 36.0–46.0)
Hemoglobin: 9 g/dL — ABNORMAL LOW (ref 12.0–15.0)
MCH: 27.4 pg (ref 26.0–34.0)
MCHC: 32.7 g/dL (ref 30.0–36.0)
MCV: 83.8 fL (ref 80.0–100.0)
Platelets: 360 K/uL (ref 150–400)
RBC: 3.28 MIL/uL — ABNORMAL LOW (ref 3.87–5.11)
RDW: 17.9 % — ABNORMAL HIGH (ref 11.5–15.5)
WBC: 9.4 K/uL (ref 4.0–10.5)
nRBC: 0 % (ref 0.0–0.2)

## 2024-09-27 LAB — MAGNESIUM: Magnesium: 1.9 mg/dL (ref 1.7–2.4)

## 2024-09-27 LAB — CBG MONITORING, ED: Glucose-Capillary: 103 mg/dL — ABNORMAL HIGH (ref 70–99)

## 2024-09-27 LAB — RENAL FUNCTION PANEL
Albumin: 3.2 g/dL — ABNORMAL LOW (ref 3.5–5.0)
Anion gap: 17 — ABNORMAL HIGH (ref 5–15)
BUN: 113 mg/dL — ABNORMAL HIGH (ref 8–23)
CO2: 13 mmol/L — ABNORMAL LOW (ref 22–32)
Calcium: 9.6 mg/dL (ref 8.9–10.3)
Chloride: 103 mmol/L (ref 98–111)
Creatinine, Ser: 5.07 mg/dL — ABNORMAL HIGH (ref 0.44–1.00)
GFR, Estimated: 8 mL/min — ABNORMAL LOW (ref >60)
Glucose, Bld: 103 mg/dL — ABNORMAL HIGH (ref 70–99)
Phosphorus: 7.7 mg/dL — ABNORMAL HIGH (ref 2.5–4.6)
Potassium: 5.1 mmol/L (ref 3.5–5.1)
Sodium: 133 mmol/L — ABNORMAL LOW (ref 135–145)

## 2024-09-27 LAB — PHOSPHORUS: Phosphorus: 7.7 mg/dL — ABNORMAL HIGH (ref 2.5–4.6)

## 2024-09-27 MED ORDER — ORAL CARE MOUTH RINSE: 15.0000 mL | OROMUCOSAL | Status: DC | PRN

## 2024-09-27 MED ORDER — SENNOSIDES-DOCUSATE SODIUM 8.6-50 MG PO TABS
1.0000 | ORAL_TABLET | Freq: Every evening | ORAL | Status: DC | PRN
  Administered 2024-09-27: 1 via ORAL
  Filled 2024-09-27: qty 1

## 2024-09-27 MED ORDER — SODIUM CHLORIDE 0.9% FLUSH
3.0000 mL | Freq: Two times a day (BID) | INTRAVENOUS | Status: DC
  Administered 2024-09-27 (×3): 3 mL via INTRAVENOUS

## 2024-09-27 MED ORDER — HEPARIN SODIUM (PORCINE) 5000 UNIT/ML IJ SOLN
5000.0000 [IU] | Freq: Three times a day (TID) | INTRAMUSCULAR | Status: DC
  Administered 2024-09-27 – 2024-10-02 (×15): 5000 [IU] via SUBCUTANEOUS
  Filled 2024-09-27 (×16): qty 1

## 2024-09-27 MED ORDER — SODIUM BICARBONATE 8.4 % IV SOLN
INTRAVENOUS | Status: DC
  Filled 2024-09-27 (×2): qty 150

## 2024-09-27 MED ORDER — ATENOLOL 25 MG PO TABS
25.0000 mg | ORAL_TABLET | Freq: Every morning | ORAL | Status: DC
  Administered 2024-09-27: 25 mg via ORAL
  Filled 2024-09-27: qty 1

## 2024-09-27 MED ORDER — ACETAMINOPHEN 650 MG RE SUPP: 650.0000 mg | Freq: Four times a day (QID) | RECTAL | Status: DC | PRN

## 2024-09-27 MED ORDER — ALBUMIN HUMAN 5 % IV SOLN
12.5000 g | Freq: Once | INTRAVENOUS | Status: AC
  Administered 2024-09-27: 12.5 g via INTRAVENOUS
  Filled 2024-09-27: qty 250

## 2024-09-27 MED ORDER — SODIUM CHLORIDE 0.9 % IV SOLN
2.0000 g | INTRAVENOUS | Status: DC
  Administered 2024-09-28 – 2024-10-01 (×4): 2 g via INTRAVENOUS
  Filled 2024-09-27 (×4): qty 20

## 2024-09-27 MED ORDER — ATENOLOL 50 MG PO TABS: 50.0000 mg | ORAL_TABLET | Freq: Every morning | ORAL | Status: DC

## 2024-09-27 MED ORDER — ACETAMINOPHEN 325 MG PO TABS
650.0000 mg | ORAL_TABLET | Freq: Four times a day (QID) | ORAL | Status: DC | PRN
  Administered 2024-09-27 – 2024-10-02 (×8): 650 mg via ORAL
  Filled 2024-09-27 (×10): qty 2

## 2024-09-27 MED ORDER — AMLODIPINE BESYLATE 5 MG PO TABS: 5.0000 mg | ORAL_TABLET | Freq: Every day | ORAL | Status: DC

## 2024-09-27 MED ORDER — ROSUVASTATIN CALCIUM 10 MG PO TABS
10.0000 mg | ORAL_TABLET | Freq: Every day | ORAL | Status: DC
  Administered 2024-09-27 – 2024-10-01 (×5): 10 mg via ORAL
  Filled 2024-09-27 (×5): qty 1

## 2024-09-27 NOTE — TOC Initial Note (Signed)
 Transition of Care [facility]) - Initial/Assessment Note    Patient Details  Name: Ashley Dean MRN: [id number] Date of Birth: June 07, 1941  [facility]) CM/SW Contact:    Bascom Service, RN Phone Number: 09/27/2024, 3:22 PM  Clinical Narrative:   Spoke to Bethany(grand dtr) d/c plan home. Has own transport home.                Expected Discharge Plan: Home/Self Care Barriers to Discharge: Continued Medical Work up   Patient Goals and CMS Choice Patient states their goals for this hospitalization and ongoing recovery are:: Home CMS Medicare.gov Compare Post Acute Care list provided to:: Patient Represenative (must comment) (Bethany(grand dtr)) Choice offered to / list presented to : Adult Children [place] ownership interest in [facility].provided to:: Adult Children    Expected Discharge Plan and Services   Discharge Planning Services: CM Consult   Living arrangements for the past 2 months: Single Family Home                                      Prior Living Arrangements/Services Living arrangements for the past 2 months: Single Family Home Lives with:: Adult Children                   Activities of Daily Living      Permission Sought/Granted                  Emotional Assessment              Admission diagnosis:  Hyperkalemia [E87.5] Acute renal failure (ARF) [N17.9] Urinary tract infection without hematuria, site unspecified [N39.0] Acute renal failure, unspecified acute renal failure type [N17.9] Patient Active Problem List   Diagnosis Date Noted   Acute renal failure (ARF) 09/27/2024   Essential hypertension 09/27/2024   Hyperlipidemia 09/27/2024   Type 2 diabetes mellitus (HCC) 09/27/2024   CKD stage 3a, GFR 45-59 ml/min (HCC) 09/27/2024   PCP:  [name], MD Pharmacy:   [facility] - [place], [place] - [address] [place]  [address] Phone: [phone] Fax: [phone]  [facility] [address] [place] [address] Phone: [phone] Fax: [phone]  [id number] [place], [place] - [address] AT [facility] OF [facility] & [address] [place] [address] Phone: [phone] Fax: [phone]  [facility] [address] [place] - [address] [facility] [place] [address] Phone: [phone] Fax: [phone]     Social Drivers of Health (SDOH) Social History: SDOH Screenings   Tobacco Use: Medium Risk (09/26/2024)   SDOH Interventions:     Readmission Risk Interventions     No data to display

## 2024-09-27 NOTE — Consult Note (Signed)
 Urology Consult Note   Requesting Attending Physician:  Madelyne Owen LABOR, MD Service Providing Consult: Urology  Consulting Attending: Dr. Nieves   Reason for Consult: Right UPJ obstruction with associated hydronephrosis  HPI: Ashley Dean is seen in consultation for reasons noted above at the request of Regalado, Owen A, MD. Patient is a 83 y.o. female presenting with dysuria, reduced UOP, nausea and vomiting, intermittently for months per her son.  Patient is known to our practice and is presently being followed by Dr. Devere for right UPJ obstruction.  She was seen in the office yesterday on 09/26/2024 where repeat CT scan was ordered but surgery was not considered, presumably based on her advanced age, comorbidities, and low functional reserves.  PMH significant for T2DM, HLD, recurrent UTI, CKD 3A, and chronic UPJ obstruction.   My arrival patient was resting comfortably in the emergency department and she was accompanied by her son.  She is extremely hard of hearing and her hearing aids unfortunately were not working.  I was able to get her daughter-in-law Katheryn on the phone, who accompanied her to her appointment yesterday to review case and plan.  CT imaging again noted established right UPJ obstruction and bladder wall thickening.  Urinalysis was relatively unimpressive but did reflect some bacteria and possible infection.  She remains hemodynamically stable and afebrile  ------------------  Assessment:   83 y.o. female with ARF, longstanding N/V, chronic right UPJ obstruction   Recommendations: # UTI # Right UPJ obstruction # ARF  Son reports ongoing nausea,/vomiting, and poor oral intake.  Her UPJ obstruction is longstanding and while it may play some role in her depressed renal function, I do not suspect that it is a primary variable.  Her contralateral kidney should not be obstructed unless some of this was from her bladder thickening, which is possible.  The present  Foley catheter along with rehydration will address this.  We will defer to her surgeon for ultimate recommendations but Ashley Dean does not appear to be a good surgical candidate for pyeloplasty.  Should she show signs of developing systemic infection we could reconsider ureteral stent versus percutaneous nephrostomy tube placement.  Shared decision was made to proceed conservatively for the time being.  Her bladder thickening suggests more advanced or potentially longstanding UTI than her urinalysis would indicate.  Agree with continuing bladder decompression with Foley catheter and continuing IV ABX.  Urology will follow.  Case and plan discussed with Dr. Nieves  Past Medical History: Past Medical History:  Diagnosis Date   Arthritis    back   Depression    Diverticulosis of colon    Fibromyalgia    Headache    History of adenomatous polyp of colon    09-20-2004   History of kidney stones    History of pelvic mass    12-04-2015  s/p  lap. 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   History of urinary retention    hx post-op retention x 1 yrs ago none since   Hyperlipidemia    Hypertension    Left ureteral stone    Macular degeneration    Osteopenia    Type 2 diabetes mellitus (HCC)    Wears dentures    upper   Wears glasses     Past Surgical History:  Past Surgical History:  Procedure Laterality Date   ANKLE SURGERY Bilateral    yrs ago   CATARACT EXTRACTION W/ INTRAOCULAR LENS  IMPLANT, BILATERAL  1992   CYSTOSCOPY W/ URETERAL STENT PLACEMENT Right 04/16/2022   Procedure: CYSTOSCOPY WITH RIGHT RETROGRADE PYELOGRAM/ right ureteroscopy/  URETERAL STENT PLACEMENT;  Surgeon: Devere Lonni Righter, MD;  Location: [facility];  Service: Urology;  Laterality: Right;   CYSTOSCOPY WITH URETEROSCOPY AND STENT PLACEMENT Left 05/19/2017   Procedure: CYSTOSCOPY WITH URETEROSCOPY/ STONE EXTRACTION;  Surgeon: Watt Rush, MD;   Location: [facility];  Service: Urology;  Laterality: Left;   CYSTOSCOPY/URETEROSCOPY/HOLMIUM LASER/STENT PLACEMENT Right 03/16/2024   Procedure: CYSTOSCOPY/URETEROSCOPY/STENT PLACEMENT/RETROGRADE;  Surgeon: Devere Lonni Righter, MD;  Location: [facility];  Service: Urology;  Laterality: Right;  CYSTOSCOPY/RIGHT RETROGRADE PYELOGRAM/URETEROSCOPY/POSSIBLE HOLMIUM LASER/POSSIBLE STENT PLACEMENT   EXTRACORPOREAL SHOCK WAVE LITHOTRIPSY Left last one 11/08/2015   previously x2   LAPAROSCOPIC CHOLECYSTECTOMY     04-09-2005   dr toth   LAPAROSCOPY W/ BILATERAL SALPINGOOPHORECTOMY   12-04-2015   at [facility])   NEPHROLITHOTOMY  1978   ORIF RIGHT ANKLE FX  11/09/2008   TONSILLECTOMY  child   VAGINAL HYSTERECTOMY  1978    Medication: Current Facility-Administered Medications  Medication Dose Route Frequency Provider Last Rate Last Admin   acetaminophen  (TYLENOL ) tablet 650 mg  650 mg Oral Q6H PRN Fernand Prost, MD       Or   acetaminophen  (TYLENOL ) suppository 650 mg  650 mg Rectal Q6H PRN Khan, Ghalib, MD       atenolol (TENORMIN) tablet 25 mg  25 mg Oral q morning Regalado, Belkys A, MD   25 mg at 09/27/24 1007   [START ON 09/28/2024] cefTRIAXone  (ROCEPHIN ) 2 g in sodium chloride  0.9 % 100 mL IVPB  2 g Intravenous Q24H Regalado, Belkys A, MD       heparin injection 5,000 Units  5,000 Units Subcutaneous Q8H Fernand Prost, MD   5,000 Units at 09/27/24 0548   rosuvastatin (CRESTOR) tablet 10 mg  10 mg Oral QHS Khan, Ghalib, MD       senna-docusate (Senokot-S) tablet 1 tablet  1 tablet Oral QHS PRN Fernand Prost, MD       sodium bicarbonate 150 mEq in dextrose  5 % 1,150 mL infusion   Intravenous Continuous Regalado, Belkys A, MD       sodium chloride  flush (NS) 0.9 % injection 3 mL  3 mL Intravenous Q12H Khan, Ghalib, MD   3 mL at 09/27/24 1012   Current Outpatient Medications  Medication Sig Dispense Refill   acetaminophen  (TYLENOL ) 650 MG CR tablet Take 650 mg by mouth daily as  needed for pain.     beta carotene w/minerals (OCUVITE) tablet Take 1 tablet by mouth daily. Capsule form      CALCIUM PO Take 1 tablet by mouth in the morning and at bedtime.     Ascorbic Acid (VITAMIN C PO) Take 1 tablet by mouth daily.     atenolol (TENORMIN) 50 MG tablet Take 50 mg by mouth every morning.      DULoxetine (CYMBALTA) 30 MG capsule Take 90 mg by mouth at bedtime.     empagliflozin (JARDIANCE) 25 MG TABS tablet Take 25 mg by mouth at bedtime.     icosapent Ethyl (VASCEPA) 1 g capsule Take 1 g by mouth 2 (two) times daily.     lisinopril-hydrochlorothiazide (PRINZIDE,ZESTORETIC) 20-25 MG tablet Take 1 tablet by mouth every morning.     LORazepam (ATIVAN) 0.5 MG tablet Take 0.5 mg by mouth at bedtime as needed for anxiety or sleep.     metFORMIN (GLUCOPHAGE) 500 MG tablet Take  1,000 mg by mouth 2 (two) times daily with a meal. (Patient not taking: Reported on 03/11/2024)     oxybutynin  (DITROPAN ) 5 MG tablet Take 1 tablet (5 mg total) by mouth every 8 (eight) hours as needed for bladder spasms. 30 tablet 1   oxyCODONE -acetaminophen  (PERCOCET) 10-325 MG tablet Take 1 tablet by mouth 3 (three) times daily as needed for pain.     phenazopyridine  (PYRIDIUM ) 200 MG tablet Take 1 tablet (200 mg total) by mouth 3 (three) times daily for 2 days 6 tablet 0   rosuvastatin (CRESTOR) 10 MG tablet Take 10 mg by mouth at bedtime.     Semaglutide (RYBELSUS) 7 MG TABS Take 7 mg by mouth daily.     sulfamethoxazole-trimethoprim (BACTRIM DS) 800-160 MG tablet Take 1 tablet by mouth 2 (two) times daily for 7 days 14 tablet 0   VITAMIN D PO Take 1 capsule by mouth daily.      Allergies: Allergies  Allergen Reactions   Macrodantin [Nitrofurantoin Macrocrystal] Rash    Social History: Social History   Tobacco Use   Smoking status: Former    Current packs/day: 0.00    Average packs/day: 1.5 packs/day for 20.0 years (30.0 ttl pk-yrs)    Types: Cigarettes    Start date: 11/10/1982    Quit date:  11/10/2002    Years since quitting: 21.8    Passive exposure: Never   Smokeless tobacco: Never  Vaping Use   Vaping status: Never Used  Substance Use Topics   Alcohol use: No   Drug use: No    Comment: no    Family History History reviewed. No pertinent family history.  Review of Systems  Gastrointestinal:  Positive for nausea and vomiting.  Genitourinary:  Positive for flank pain. Negative for dysuria, frequency, hematuria and urgency.     Objective   Vital signs in last 24 hours: BP 117/64   Pulse 85   Temp 98 F (36.7 C) (Oral)   Resp 16   SpO2 100%   Physical Exam General: A&O, resting, appropriate HEENT: [place]/AT, extremely hard of hearing Pulmonary: Normal work of breathing Cardiovascular: no cyanosis Abdomen: Soft, NTTP, nondistended GU: Foley catheter draining cloudy yellow urine   Most Recent Labs: Lab Results  Component Value Date   WBC 9.4 09/27/2024   HGB 9.0 (L) 09/27/2024   HCT 27.5 (L) 09/27/2024   PLT 360 09/27/2024    Lab Results  Component Value Date   NA 133 (L) 09/27/2024   K 5.1 09/27/2024   CL 103 09/27/2024   CO2 13 (L) 09/27/2024   BUN 113 (H) 09/27/2024   CREATININE 5.07 (H) 09/27/2024   CALCIUM 9.6 09/27/2024   MG 1.9 09/27/2024   PHOS 7.7 (H) 09/27/2024   PHOS 7.7 (H) 09/27/2024    Lab Results  Component Value Date   INR 0.9 06/04/2021     Urine Culture: @[id number] (laburin,org,[id number],[id number])@   IMAGING: CT ABDOMEN PELVIS WO CONTRAST Result Date: 09/26/2024 EXAM: CT ABDOMEN AND PELVIS WITHOUT CONTRAST 09/26/2024 09:42:35 PM TECHNIQUE: CT of the abdomen and pelvis was performed without the administration of intravenous contrast. Multiplanar reformatted images are provided for review. Automated exposure control, iterative reconstruction, and/or weight-based adjustment of the mA/kV was utilized to reduce the radiation dose to as low as reasonably achievable. COMPARISON: 07/08/2024 CLINICAL HISTORY: Pain with urination,  abdominal pain, urinary retention. FINDINGS: LOWER CHEST: No acute abnormality. LIVER: The liver is unremarkable. GALLBLADDER AND BILE DUCTS: Status post cholecystectomy. No biliary ductal dilatation. SPLEEN: No  acute abnormality. PANCREAS: No acute abnormality. ADRENAL GLANDS: No acute abnormality. KIDNEYS, URETERS AND BLADDER: Stable at 14 mm hyperdense exophytic lesion arises from the interval region of the right kidney laterally since remote prior examination of 06/04/2021 which is in keeping with a hyperdense cyst. No follow up imaging is recommended. Multiple nonobstructive 1 - 2 mm punctate renal calculi are again identified bilaterally, unchanged from prior examination. There is interval development of bilateral urothelial thickening and periureteric inflammatory stranding as well as development of mild hydronephrosis and progressive distention of the dilated right renal pelvis which may reflect changes of bilateral ascending urinary tract infection. There is progressive circumferential bladder wall thickening and subtle periarticular inflammatory stranding suggesting an underlying infectious or inflammatory cystitis. Correlation with urinalysis and urine culture may be helpful for further management. No ureteral calculi. No perinephric inflammatory stranding or fluid collection identified. GI AND BOWEL: Appendix normal. The stomach, small bowel, and large bowel are otherwise unremarkable. PERITONEUM AND RETROPERITONEUM: No ascites. No free air. VASCULATURE: Aorta is normal in caliber. Moderate aortoiliac atherosclerotic calcification. No aortic aneurysm. LYMPH NODES: No lymphadenopathy. REPRODUCTIVE ORGANS: Uterus absent. No adnexal masses. BONES AND SOFT TISSUES: Osseous structures are age appropriate. No acute bone abnormality. No lytic or blastic bone lesion. Small broad-based fat-containing umbilical hernia. No focal soft tissue abnormality. IMPRESSION: 1. Interval development of bilateral urothelial  thickening with periureteric inflammatory stranding, mild hydronephrosis, and progressive distention of the dilated right renal pelvis, suggestive of bilateral ascending urinary tract infection; no ureteral calculi . no perinephric inflammatory stranding or fluid collection identified 2. Progressive circumferential bladder wall thickening with subtle perivesical inflammatory stranding, suggestive of infectious or inflammatory cystitis; correlation with urinalysis and urine culture may be helpful for further management 3. Stable 14 mm hyperdense exophytic right renal cyst, compatible with benign hyperdense cyst; no follow-up imaging recommended 4. Multiple bilateral nonobstructive punctate renal calculi, unchanged 5. raf score: Aortic atherosclerosis (icd10-i70.0) Electronically signed by: Dorethia Molt MD 09/26/2024 10:32 PM EST RP Workstation: [id number]    ------  Ole Bourdon, NP Pager: [phone]   Please contact the urology consult pager with any further questions/concerns.

## 2024-09-27 NOTE — ED Notes (Signed)
Patient is sleeping at this time.

## 2024-09-27 NOTE — Progress Notes (Addendum)
 PROGRESS NOTE    Ashley Dean  FMW:[id number] DOB: 1941/01/11 DOA: 09/26/2024 PCP: [name], MD   Brief Narrative: 83 year old with past medical history significant for hypertension, hyperlipidemia, diabetes type 2, recurrent UTI, CKD 3A presents with dysuria and inability to void.  Reports dysuria and decreased urine output, symptoms ongoing for a few months.  She has follow-up with urology and has had ureteroscopy that did not show any intrinsic obstruction, her hydronephrosis was thought to be related to her scoliosis.  Evaluation in the ED lab work consistent with leukocytosis, thrombocytosis, hyperkalemia anion gap metabolic acidosis AKI with a creatinine of 5.6.  UA with finding consistent with urinary tract infection.  CT abdomen and pelvis without contrast showed bilateral urothelial thickening with Abran ureteric stranding and mild hydronephrosis and dilated renal pelvis with concern for ascending UTI.  Circumferential bladder wall thickening concerning for inflammatory cystitis versus infectious cystitis.  Benign renal cyst.  Patient admitted for AKI and UTI.   Assessment & Plan:   Principal Problem:   Acute renal failure (ARF) Active Problems:   Essential hypertension   Hyperlipidemia   Type 2 diabetes mellitus (HCC)   CKD stage 3a, GFR 45-59 ml/min (HCC)   1-AKI on CKD 3A: - Patient creatinine baseline 1.0.  She presented with a creatinine of 5, BUN 114.  - No significant obstruction on CT.  Foley catheter in place. - She has longstanding history of right UPJ obstruction, urology has been consulted. - Start IV bicarb drip - Patient reported history of nausea and vomiting and poor oral intake.  This is in confusion with her history of UPJ obstruction and urinary tract infection might have exacerbated acute renal failure. - Monitor urine output - If no improvement with IV fluids could consider nephrology consultation - Hydrochlorothiazide and lisinopril listed on  her med list, if she was taking this medication, this could have aggravated AKI.   Hyperkalemia: - Received Lokelma - Plan to start bicarb drip.  Hyperphosphatemia Improvement might need binders  Urinary tract infection: -Presents with dysuria, leukocytosis, UA with more than 50 white blood cell, CT abdomen and pelvis show finding  suggestive of bilateral ascending urinary tract infection; progressive circumferential bladder wall thickening with subtle perivesical inflammatory stranding suggestive of cystitis. - Follow urine culture - Continue with IV ceftriaxone   Hypertension: - Continue atenolol lower dose due to AKI - Awaiting home med rec - Addendum: Will discontinue atenolol due to soft low blood pressure  Hyperlipidemia: - Continue Crestor  Diabetes type 2: Unclear if she was on metformin prior to admission.  Will definitely need to hold for now Monitor CBG  Hyponatremia in the setting of AKI  Anemia monitor hemoglobin  Estimated body mass index is 23.3 kg/m as calculated from the following:   Height as of 03/16/24: 5' 5 (1.651 m).   Weight as of 03/16/24: 63.5 kg.   DVT prophylaxis: Heparin Code Status: Full code Family Communication:Daughter in law updated Disposition Plan:  Status is: Inpatient Remains inpatient appropriate because: Management of AKI    Consultants:  Urology   Procedures:  none  Antimicrobials:    Subjective: Patient is alert, poor historian.  She reports that she was vomiting multiple times last week while taking antibiotics. She  appears dry Objective: Vitals:   09/26/24 2001 09/26/24 2313 09/27/24 0301 09/27/24 0406  BP: (!) 108/51 109/86  (!) 145/128  Pulse: 73 77  86  Resp: 16 17  17   Temp: 97.7 F (36.5 C) 98 F (36.7  C) 98.4 F (36.9 C) 98.6 F (37 C)  TempSrc: Oral     SpO2: 91% 96%  (!) 88%   No intake or output data in the 24 hours ending 09/27/24 0747 There were no vitals filed for this  visit.  Examination:  General exam: Appears calm and comfortable  Respiratory system: Clear to auscultation. Respiratory effort normal. Cardiovascular system: S1 & S2 heard, RRR.  Gastrointestinal system: Abdomen is nondistended, soft and nontender. No organomegaly or masses felt. Normal bowel sounds heard. Central nervous system: Alert Extremities: Symmetric 5 x 5 power.   Data Reviewed: I have personally reviewed following labs and imaging studies  CBC: Recent Labs  Lab 09/26/24 2046 09/27/24 0416  WBC 11.4* 9.4  NEUTROABS 6.6  --   HGB 10.5* 9.0*  HCT 32.0* 27.5*  MCV 84.9 83.8  PLT 436* 360   Basic Metabolic Panel: Recent Labs  Lab 09/26/24 2046 09/27/24 0416  NA 134* 133*  K 5.7* 5.1  CL 102 103  CO2 14* 13*  GLUCOSE 108* 103*  BUN 114* 113*  CREATININE 5.60* 5.07*  CALCIUM 10.0 9.6  MG  --  1.9  PHOS  --  7.7*  7.7*   GFR: CrCl cannot be calculated (Unknown ideal weight.). Liver Function Tests: Recent Labs  Lab 09/26/24 2046 09/27/24 0416  AST 30  --   ALT 20  --   ALKPHOS 72  --   BILITOT 0.3  --   PROT 7.4  --   ALBUMIN 3.7 3.2*   No results for input(s): LIPASE, AMYLASE in the last 168 hours. No results for input(s): AMMONIA in the last 168 hours. Coagulation Profile: No results for input(s): INR, PROTIME in the last 168 hours. Cardiac Enzymes: No results for input(s): CKTOTAL, CKMB, CKMBINDEX, TROPONINI in the last 168 hours. BNP (last 3 results) No results for input(s): PROBNP in the last 8760 hours. HbA1C: No results for input(s): HGBA1C in the last 72 hours. CBG: No results for input(s): GLUCAP in the last 168 hours. Lipid Profile: No results for input(s): CHOL, HDL, LDLCALC, TRIG, CHOLHDL, LDLDIRECT in the last 72 hours. Thyroid  Function Tests: No results for input(s): TSH, T4TOTAL, FREET4, T3FREE, THYROIDAB in the last 72 hours. Anemia Panel: No results for input(s): VITAMINB12,  FOLATE, FERRITIN, TIBC, IRON, RETICCTPCT in the last 72 hours. Sepsis Labs: No results for input(s): PROCALCITON, LATICACIDVEN in the last 168 hours.  No results found for this or any previous visit (from the past 240 hours).       Radiology Studies: CT ABDOMEN PELVIS WO CONTRAST Result Date: 09/26/2024 EXAM: CT ABDOMEN AND PELVIS WITHOUT CONTRAST 09/26/2024 09:42:35 PM TECHNIQUE: CT of the abdomen and pelvis was performed without the administration of intravenous contrast. Multiplanar reformatted images are provided for review. Automated exposure control, iterative reconstruction, and/or weight-based adjustment of the mA/kV was utilized to reduce the radiation dose to as low as reasonably achievable. COMPARISON: 07/08/2024 CLINICAL HISTORY: Pain with urination, abdominal pain, urinary retention. FINDINGS: LOWER CHEST: No acute abnormality. LIVER: The liver is unremarkable. GALLBLADDER AND BILE DUCTS: Status post cholecystectomy. No biliary ductal dilatation. SPLEEN: No acute abnormality. PANCREAS: No acute abnormality. ADRENAL GLANDS: No acute abnormality. KIDNEYS, URETERS AND BLADDER: Stable at 14 mm hyperdense exophytic lesion arises from the interval region of the right kidney laterally since remote prior examination of 06/04/2021 which is in keeping with a hyperdense cyst. No follow up imaging is recommended. Multiple nonobstructive 1 - 2 mm punctate renal calculi are again identified bilaterally,  unchanged from prior examination. There is interval development of bilateral urothelial thickening and periureteric inflammatory stranding as well as development of mild hydronephrosis and progressive distention of the dilated right renal pelvis which may reflect changes of bilateral ascending urinary tract infection. There is progressive circumferential bladder wall thickening and subtle periarticular inflammatory stranding suggesting an underlying infectious or inflammatory cystitis.  Correlation with urinalysis and urine culture may be helpful for further management. No ureteral calculi. No perinephric inflammatory stranding or fluid collection identified. GI AND BOWEL: Appendix normal. The stomach, small bowel, and large bowel are otherwise unremarkable. PERITONEUM AND RETROPERITONEUM: No ascites. No free air. VASCULATURE: Aorta is normal in caliber. Moderate aortoiliac atherosclerotic calcification. No aortic aneurysm. LYMPH NODES: No lymphadenopathy. REPRODUCTIVE ORGANS: Uterus absent. No adnexal masses. BONES AND SOFT TISSUES: Osseous structures are age appropriate. No acute bone abnormality. No lytic or blastic bone lesion. Small broad-based fat-containing umbilical hernia. No focal soft tissue abnormality. IMPRESSION: 1. Interval development of bilateral urothelial thickening with periureteric inflammatory stranding, mild hydronephrosis, and progressive distention of the dilated right renal pelvis, suggestive of bilateral ascending urinary tract infection; no ureteral calculi . no perinephric inflammatory stranding or fluid collection identified 2. Progressive circumferential bladder wall thickening with subtle perivesical inflammatory stranding, suggestive of infectious or inflammatory cystitis; correlation with urinalysis and urine culture may be helpful for further management 3. Stable 14 mm hyperdense exophytic right renal cyst, compatible with benign hyperdense cyst; no follow-up imaging recommended 4. Multiple bilateral nonobstructive punctate renal calculi, unchanged 5. raf score: Aortic atherosclerosis (icd10-i70.0) Electronically signed by: Dorethia Molt MD 09/26/2024 10:32 PM EST RP Workstation: [id number]        Scheduled Meds:  amLODipine  5 mg Oral Daily   atenolol  50 mg Oral q morning   heparin  5,000 Units Subcutaneous Q8H   rosuvastatin  10 mg Oral QHS   sodium chloride  flush  3 mL Intravenous Q12H   Continuous Infusions:   LOS: 0 days    Time spent: 35  Minutes    Edlyn Rosenburg A Ayrton Mcvay, MD [facility]   If 7PM-7AM, please contact night-coverage [email or web address]  09/27/2024, 7:47 AM

## 2024-09-27 NOTE — Progress Notes (Signed)
 [facility]   Bed Availability Yes  Level of Care Needed:  Yes  MD Agree to transfer: No  MD says needs urology  Patient agree to transfer: Yes  Sharolyn Batman, RN [facility]

## 2024-09-28 DIAGNOSIS — N1831 Chronic kidney disease, stage 3a: Secondary | ICD-10-CM

## 2024-09-28 DIAGNOSIS — N179 Acute kidney failure, unspecified: Secondary | ICD-10-CM | POA: Diagnosis not present

## 2024-09-28 DIAGNOSIS — E875 Hyperkalemia: Secondary | ICD-10-CM | POA: Diagnosis not present

## 2024-09-28 LAB — RENAL FUNCTION PANEL
Albumin: 3 g/dL — ABNORMAL LOW (ref 3.5–5.0)
Anion gap: 13 (ref 5–15)
BUN: 102 mg/dL — ABNORMAL HIGH (ref 8–23)
CO2: 21 mmol/L — ABNORMAL LOW (ref 22–32)
Calcium: 8.6 mg/dL — ABNORMAL LOW (ref 8.9–10.3)
Chloride: 102 mmol/L (ref 98–111)
Creatinine, Ser: 3.9 mg/dL — ABNORMAL HIGH (ref 0.44–1.00)
GFR, Estimated: 11 mL/min — ABNORMAL LOW (ref >60)
Glucose, Bld: 188 mg/dL — ABNORMAL HIGH (ref 70–99)
Phosphorus: 4.7 mg/dL — ABNORMAL HIGH (ref 2.5–4.6)
Potassium: 4.2 mmol/L (ref 3.5–5.1)
Sodium: 136 mmol/L (ref 135–145)

## 2024-09-28 LAB — CBC
HCT: 22.4 % — ABNORMAL LOW (ref 36.0–46.0)
HCT: 26.6 % — ABNORMAL LOW (ref 36.0–46.0)
Hemoglobin: 7.6 g/dL — ABNORMAL LOW (ref 12.0–15.0)
Hemoglobin: 8.7 g/dL — ABNORMAL LOW (ref 12.0–15.0)
MCH: 28.4 pg (ref 26.0–34.0)
MCH: 28.2 pg (ref 26.0–34.0)
MCHC: 33.9 g/dL (ref 30.0–36.0)
MCHC: 32.7 g/dL (ref 30.0–36.0)
MCV: 83.6 fL (ref 80.0–100.0)
MCV: 86.4 fL (ref 80.0–100.0)
Platelets: 300 K/uL (ref 150–400)
Platelets: 266 K/uL (ref 150–400)
RBC: 2.68 MIL/uL — ABNORMAL LOW (ref 3.87–5.11)
RBC: 3.08 MIL/uL — ABNORMAL LOW (ref 3.87–5.11)
RDW: 18 % — ABNORMAL HIGH (ref 11.5–15.5)
RDW: 18.1 % — ABNORMAL HIGH (ref 11.5–15.5)
WBC: 7.8 K/uL (ref 4.0–10.5)
WBC: 8.2 K/uL (ref 4.0–10.5)
nRBC: 0 % (ref 0.0–0.2)
nRBC: 0 % (ref 0.0–0.2)

## 2024-09-28 LAB — URINE CULTURE

## 2024-09-28 LAB — URINALYSIS, W/ REFLEX TO CULTURE (INFECTION SUSPECTED)
Bacteria, UA: NONE SEEN
Bilirubin Urine: NEGATIVE
Glucose, UA: NEGATIVE mg/dL
Ketones, ur: NEGATIVE mg/dL
Nitrite: NEGATIVE
Protein, ur: 100 mg/dL — AB
RBC / HPF: >50 RBC/hpf (ref 0–5)
Specific Gravity, Urine: 1.009 (ref 1.005–1.030)
WBC, UA: >50 WBC/hpf (ref 0–5)
pH: 6 (ref 5.0–8.0)

## 2024-09-28 LAB — GLUCOSE, CAPILLARY: Glucose-Capillary: 159 mg/dL — ABNORMAL HIGH (ref 70–99)

## 2024-09-28 LAB — PREPARE RBC (CROSSMATCH)

## 2024-09-28 LAB — LACTIC ACID, PLASMA: Lactic Acid, Venous: 1.3 mmol/L (ref 0.5–1.9)

## 2024-09-28 LAB — ABO/RH: ABO/RH(D): O POS

## 2024-09-28 LAB — MRSA NEXT GEN BY PCR, NASAL: MRSA by PCR Next Gen: NOT DETECTED

## 2024-09-28 MED ORDER — MIDODRINE HCL 5 MG PO TABS
10.0000 mg | ORAL_TABLET | Freq: Three times a day (TID) | ORAL | Status: DC
  Administered 2024-09-28 – 2024-09-29 (×4): 10 mg via ORAL
  Filled 2024-09-28 (×4): qty 2

## 2024-09-28 MED ORDER — ALBUMIN HUMAN 5 % IV SOLN
25.0000 g | Freq: Once | INTRAVENOUS | Status: AC
  Administered 2024-09-28: 25 g via INTRAVENOUS
  Filled 2024-09-28: qty 500

## 2024-09-28 MED ORDER — ORAL CARE MOUTH RINSE: 15.0000 mL | OROMUCOSAL | Status: DC | PRN

## 2024-09-28 MED ORDER — SODIUM CHLORIDE 0.9 % IV BOLUS
500.0000 mL | Freq: Once | INTRAVENOUS | Status: AC
  Administered 2024-09-28: 500 mL via INTRAVENOUS

## 2024-09-28 MED ORDER — SODIUM CHLORIDE 0.9 % IV SOLN: INTRAVENOUS | Status: AC

## 2024-09-28 MED ORDER — CHLORHEXIDINE GLUCONATE CLOTH 2 % EX PADS
6.0000 | MEDICATED_PAD | Freq: Every day | CUTANEOUS | Status: DC
  Administered 2024-09-28 – 2024-09-30 (×3): 6 via TOPICAL

## 2024-09-28 MED ORDER — SODIUM CHLORIDE 0.9 % IV BOLUS
250.0000 mL | Freq: Once | INTRAVENOUS | Status: AC
  Administered 2024-09-28: 250 mL via INTRAVENOUS

## 2024-09-28 MED ORDER — HYOSCYAMINE SULFATE 0.125 MG SL SUBL
0.1250 mg | SUBLINGUAL_TABLET | SUBLINGUAL | Status: DC | PRN
  Administered 2024-09-28 – 2024-10-01 (×8): 0.125 mg via SUBLINGUAL
  Filled 2024-09-28 (×11): qty 1

## 2024-09-28 MED ORDER — POLYETHYLENE GLYCOL 3350 17 G PO PACK: 17.0000 g | PACK | Freq: Every day | ORAL | Status: DC | PRN

## 2024-09-28 MED ORDER — SODIUM CHLORIDE 0.9% IV SOLUTION: Freq: Once | INTRAVENOUS | Status: AC

## 2024-09-28 NOTE — Progress Notes (Signed)
 Subjective: Patient resting on exam.  Unable to communicate.  Hearing aids still charging  Objective: Vital signs in last 24 hours: Temp:  [97.8 F (36.6 C)-99.3 F (37.4 C)] 99.3 F (37.4 C) (11/19 1433) Pulse Rate:  [70-93] 88 (11/19 1433) Resp:  [15-20] 20 (11/19 1433) BP: (72-101)/(41-63) 86/41 (11/19 1433) SpO2:  [95 %-100 %] 95 % (11/19 1433) Weight:  [57.5 kg-60.2 kg] 60.2 kg (11/19 0358)  Assessment/Plan: # UTI # Right UPJ obstruction # ARF  Continue to trend labs and continue volume resuscitation.  Interval improvement in serum creatinine.  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  If forced will consider ureteral stent versus PCNT.  Urinalysis contaminated with multiple species.  Unlikely to get beneficial sample after multiple doses of ABX.  Continue to treat empirically.  Urology will follow peripherally  Intake/Output from previous day: 11/18 0701 - 11/19 0700 In: 1529.3 [P.O.:240; I.V.:1130.9; IV Piggyback:158.4] Out: 1175 [Urine:1175]  Intake/Output this shift: Total I/O In: 706.9 [I.V.:515.3; IV Piggyback:191.6] Out: 775 [Urine:775]  Physical Exam:  General: Alert and oriented CV: No cyanosis Lungs: equal chest rise Abdomen: Soft, NTND, no rebound or guarding Gu: Foley catheter in place draining cloudy yellow urine  Lab Results: Recent Labs    09/26/24 2046 09/27/24 0416 09/28/24 0532  HGB 10.5* 9.0* 7.6*  HCT 32.0* 27.5* 22.4*   BMET Recent Labs    09/27/24 0416 09/28/24 0532  NA 133* 136  K 5.1 4.2  CL 103 102  CO2 13* 21*  GLUCOSE 103* 188*  BUN 113* 102*  CREATININE 5.07* 3.90*  CALCIUM 9.6 8.6*  HGB 9.0* 7.6*  WBC 9.4 7.8     Studies/Results: CT ABDOMEN PELVIS WO CONTRAST Result Date: 09/26/2024 EXAM: CT ABDOMEN AND PELVIS WITHOUT CONTRAST 09/26/2024 09:42:35 PM TECHNIQUE: CT of the abdomen and pelvis was performed without the administration of intravenous contrast. Multiplanar  reformatted images are provided for review. Automated exposure control, iterative reconstruction, and/or weight-based adjustment of the mA/kV was utilized to reduce the radiation dose to as low as reasonably achievable. COMPARISON: 07/08/2024 CLINICAL HISTORY: Pain with urination, abdominal pain, urinary retention. FINDINGS: LOWER CHEST: No acute abnormality. LIVER: The liver is unremarkable. GALLBLADDER AND BILE DUCTS: Status post cholecystectomy. No biliary ductal dilatation. SPLEEN: No acute abnormality. PANCREAS: No acute abnormality. ADRENAL GLANDS: No acute abnormality. KIDNEYS, URETERS AND BLADDER: Stable at 14 mm hyperdense exophytic lesion arises from the interval region of the right kidney laterally since remote prior examination of 06/04/2021 which is in keeping with a hyperdense cyst. No follow up imaging is recommended. Multiple nonobstructive 1 - 2 mm punctate renal calculi are again identified bilaterally, unchanged from prior examination. There is interval development of bilateral urothelial thickening and periureteric inflammatory stranding as well as development of mild hydronephrosis and progressive distention of the dilated right renal pelvis which may reflect changes of bilateral ascending urinary tract infection. There is progressive circumferential bladder wall thickening and subtle periarticular inflammatory stranding suggesting an underlying infectious or inflammatory cystitis. Correlation with urinalysis and urine culture may be helpful for further management. No ureteral calculi. No perinephric inflammatory stranding or fluid collection identified. GI AND BOWEL: Appendix normal. The stomach, small bowel, and large bowel are otherwise unremarkable. PERITONEUM AND RETROPERITONEUM: No ascites. No free air. VASCULATURE: Aorta is normal in caliber. Moderate aortoiliac atherosclerotic calcification. No aortic aneurysm. LYMPH NODES: No lymphadenopathy. REPRODUCTIVE ORGANS: Uterus absent. No  adnexal masses. BONES AND SOFT TISSUES: Osseous structures are age appropriate. No  acute bone abnormality. No lytic or blastic bone lesion. Small broad-based fat-containing umbilical hernia. No focal soft tissue abnormality. IMPRESSION: 1. Interval development of bilateral urothelial thickening with periureteric inflammatory stranding, mild hydronephrosis, and progressive distention of the dilated right renal pelvis, suggestive of bilateral ascending urinary tract infection; no ureteral calculi . no perinephric inflammatory stranding or fluid collection identified 2. Progressive circumferential bladder wall thickening with subtle perivesical inflammatory stranding, suggestive of infectious or inflammatory cystitis; correlation with urinalysis and urine culture may be helpful for further management 3. Stable 14 mm hyperdense exophytic right renal cyst, compatible with benign hyperdense cyst; no follow-up imaging recommended 4. Multiple bilateral nonobstructive punctate renal calculi, unchanged 5. raf score: Aortic atherosclerosis (icd10-i70.0) Electronically signed by: Dorethia Molt MD 09/26/2024 10:32 PM EST RP Workstation: [id number]      LOS: 1 day   Ole Bourdon, NP [facility] Pager: [phone]  09/28/2024, 2:49 PM

## 2024-09-28 NOTE — Progress Notes (Signed)
 Patient reported foley catheter leakage, lower abdominal discomfort, and a sensation of needing to void. Foley balloon was deflated, catheter repositioned, then balloon reinflated. Catheter repositioned for optimal drainage. Urine noted to be pink and cloudy. Bladder scan showed 1 mL, indicating no urinary retention.  Patient educated on possible causes of leakage and discomfort, reassurance provided. Charge nurse present at bedside for assessment.

## 2024-09-28 NOTE — Evaluation (Signed)
 Physical Therapy Evaluation Patient Details Name: Ashley Dean MRN: [id number] DOB: 10/11/1941 Today's Date: 09/28/2024  History of Present Illness  83 yo female admitted with ARF. Hx of essential HTN, DM, CKD, recurrent UTI, fibromyalgia, macular degeneration, osteopenia  Clinical Impression  On eval, pt required Min A for mobility. She ambulated ~12 feet x 2 with a RW. Pt presents with general weakness, decreased activity tolerance, and impaired gait/balance. She is having lower abdominal discomfort. Family was present during session. Pt is mod independent at baseline (without use of an assistive device). She has a history of falls. Family is currently available intermittently. Will continue to follow pt during this hospital stay. Will recommend HHPT f/u at this time.       If plan is discharge home, recommend the following: A little help with walking and/or transfers;A little help with bathing/dressing/bathroom;Assistance with cooking/housework;Assist for transportation;Help with stairs or ramp for entrance   Can travel by private vehicle        Equipment Recommendations None recommended by PT  Recommendations for Other Services       Functional Status Assessment Patient has had a recent decline in their functional status and demonstrates the ability to make significant improvements in function in a reasonable and predictable amount of time.     Precautions / Restrictions Precautions Precautions: Fall Restrictions Weight Bearing Restrictions Per Provider Order: No      Mobility  Bed Mobility Overal bed mobility: Needs Assistance Bed Mobility: Supine to Sit, Sit to Supine     Supine to sit: Contact guard, HOB elevated, Used rails Sit to supine: Min assist   General bed mobility comments: Assist for LEs back onto bed. Increased time. Cues for safety    Transfers Overall transfer level: Needs assistance Equipment used: Rolling walker (2 wheels) Transfers: Sit to/from  Stand Sit to Stand: Min assist           General transfer comment: Assist to rise, steady, control descent. Cues for safety, hand placement    Ambulation/Gait Ambulation/Gait assistance: Min assist Gait Distance (Feet): 12 Feet (x2) Assistive device: Rolling walker (2 wheels) Gait Pattern/deviations: Step-through pattern, Decreased stride length       General Gait Details: Assist to steady pt and mange RW to/from bathroom. Cues for safety, posture, RW proximity. Slow gait speed.  Stairs            Wheelchair Mobility     Tilt Bed    Modified Rankin (Stroke Patients Only)       Balance Overall balance assessment: Needs assistance Sitting-balance support: Feet supported Sitting balance-Leahy Scale: Good     Standing balance support: Bilateral upper extremity supported, During functional activity, Reliant on assistive device for balance Standing balance-Leahy Scale: Fair                               Pertinent Vitals/Pain Pain Assessment Pain Assessment: Faces Faces Pain Scale: Hurts even more Pain Location: abdomen; bladder pressure Pain Descriptors / Indicators: Discomfort, Grimacing Pain Intervention(s): Limited activity within patient's tolerance, Monitored during session, Repositioned    Home Living Family/patient expects to be discharged to:: Private residence Living Arrangements: Other relatives Available Help at Discharge: Family;Available PRN/intermittently Type of Home: House Home Access: Stairs to enter Entrance Stairs-Rails: Right Entrance Stairs-Number of Steps: 4   Home Layout: One level Home Equipment: [employer] (2 wheels);[place] - single point      Prior Function Prior Level of  Function : Independent/Modified Independent;History of Falls (last six months)             Mobility Comments: mod ind (has DME but does not use) ADLs Comments: mod ind     Extremity/Trunk Assessment   Upper Extremity Assessment Upper  Extremity Assessment: Defer to OT evaluation    Lower Extremity Assessment Lower Extremity Assessment: Generalized weakness    Cervical / Trunk Assessment Cervical / Trunk Assessment: Kyphotic  Communication   Communication Communication: Impaired Factors Affecting Communication: Hearing impaired    Cognition Arousal: Alert Behavior During Therapy: WFL for tasks assessed/performed   PT - Cognitive impairments: Safety/Judgement                       PT - Cognition Comments: some mild confusion? Following commands: Intact       Cueing Cueing Techniques: Verbal cues     General Comments      Exercises     Assessment/Plan    PT Assessment Patient needs continued PT services  PT Problem List Decreased strength;Decreased range of motion;Decreased activity tolerance;Decreased balance;Decreased mobility;Decreased knowledge of use of DME;Pain       PT Treatment Interventions DME instruction;Gait training;Functional mobility training;Therapeutic activities;Patient/family education;Balance training;Therapeutic exercise    PT Goals (Current goals can be found in the Care Plan section)  Acute Rehab PT Goals Patient Stated Goal: feel better PT Goal Formulation: With patient/family Time For Goal Achievement: 10/12/24 Potential to Achieve Goals: Good    Frequency Min 3X/week     Co-evaluation               AM-PAC PT 6 Clicks Mobility  Outcome Measure Help needed turning from your back to your side while in a flat bed without using bedrails?: A Little Help needed moving from lying on your back to sitting on the side of a flat bed without using bedrails?: A Little Help needed moving to and from a bed to a chair (including a wheelchair)?: A Little Help needed standing up from a chair using your arms (e.g., wheelchair or bedside chair)?: A Little Help needed to walk in hospital room?: A Little Help needed climbing 3-5 steps with a railing? : A Little 6  Click Score: 18    End of Session   Activity Tolerance: Patient limited by pain;Patient limited by fatigue Patient left: in bed;with call bell/phone within reach;with bed alarm set;with family/visitor present   PT Visit Diagnosis: Pain;Muscle weakness (generalized) (M62.81);Difficulty in walking, not elsewhere classified (R26.2);History of falling (Z91.81);Unsteadiness on feet (R26.81)    Time: 8952-8895 PT Time Calculation (min) (ACUTE ONLY): 17 min   Charges:   PT Evaluation $PT Eval Low Complexity: 1 Low   PT General Charges $$ ACUTE PT VISIT: 1 Visit            Dannial SQUIBB, PT Acute Rehabilitation  Office: [phone]

## 2024-09-28 NOTE — TOC Progression Note (Signed)
 Transition of Care [facility]) - Progression Note    Patient Details  Name: Ashley Dean MRN: [id number] Date of Birth: 03-Jun-1941  [facility] CM/SW Contact  Azhar Knope, Nathanel, RN Phone Number: 09/28/2024, 2:46 PM  Clinical Narrative: PT recc HHPT. Spoke to [facility] dtr) no preference-[facility] chosen for HHPT-rep [employer] aware & accepted.       Expected Discharge Plan: Home w Home Health Services Barriers to Discharge: Continued Medical Work up               Expected Discharge Plan and Services   Discharge Planning Services: CM Consult Post Acute Care Choice: Home Health Living arrangements for the past 2 months: Single Family Home                           HH Arranged: PT HH Agency: [facility] Date [facility] Agency Contacted: 09/28/24 Time HH Agency Contacted: 1446 Representative spoke with at [facility] Agency: Darleene   Social Drivers of Health (SDOH) Interventions SDOH Screenings   Food Insecurity: No Food Insecurity (09/27/2024)  Housing: Low Risk  (09/27/2024)  Transportation Needs: No Transportation Needs (09/27/2024)  Utilities: Not At Risk (09/27/2024)  Social Connections: Moderately Isolated (09/27/2024)  Tobacco Use: Medium Risk (09/26/2024)    Readmission Risk Interventions     No data to display

## 2024-09-28 NOTE — Progress Notes (Addendum)
 [facility] PROGRESS NOTE    Progress Note  Ashley Dean  FMW:[id number] DOB: 02-13-1941 DOA: 09/26/2024 PCP: [name], MD     Brief Narrative:   Ashley Dean is an 83 y.o. female past medical history significant for essential hypertension, hyperlipidemia diabetes mellitus type 2, chronic kidney disease stage III AA presents with dysuria and ability to void.  She follows up with urology who did her ureteral cystoscopy did not find any intrinsic obstruction or hydronephrosis was attributed to her scoliosis.  In the ED was found to have creatinine of 5.6 (with a baseline creatinine around 1), UA were consistent with a urinary tract infection, CT scan of the abdomen pelvis showed bilateral ureteral thickening with stranding.  Also circumferential bladder wall thickening.  Urology was consulted  Assessment/Plan:   Acute renal failure (ARF) on chronic kidney disease stage IIIa: No significant obstruction seen on CT.  It is to note that she was previously on Bactrim Foley catheter was placed, started on a bicarbonate drip. Urology was consulted, hydrochlorothiazide and ACE inhibitor were held. She was started empirically on IV Rocephin . Urine cultures were sent. After Foley placement urine output has improved creatinine this morning is 3.9. Change IV fluids to normal saline acidemia has resolved. Foley per urology.  Hyperkalemia: Started on bicarb drip and Lokelma. Now resolved.  Possible UTI: Urine cultures were sent. Continue IV Rocephin .  Normocytic anemia: Hemoglobin on admission was 10, starting IV fluid this morning 7.6. Will go ahead and transfuse 1 unit of packed blood cells check CBC posttransfusion appearance he has no evidence of overt bleeding.  Hyperphosphatemia: Improving with IV fluids.  Essential hypertension Continue atenolol. Hold all other antihypertensive medication.  Hyperlipidemia Continue Crestor.  Diabetes mellitus type 2: Hold  metformin continue sliding scale insulin   DVT prophylaxis: lovenox Family Communication:neon Status is: Inpatient Remains inpatient appropriate because: Acute kidney injury    Code Status:     Code Status Orders  (From admission, onward)           Start     Ordered   09/27/24 0013  Full code  Continuous       Question:  By:  Answer:  Consent: discussion documented in EHR   09/27/24 0014           Code Status History     This patient has a current code status but no historical code status.         IV Access:   Peripheral IV   Procedures and diagnostic studies:   CT ABDOMEN PELVIS WO CONTRAST Result Date: 09/26/2024 EXAM: CT ABDOMEN AND PELVIS WITHOUT CONTRAST 09/26/2024 09:42:35 PM TECHNIQUE: CT of the abdomen and pelvis was performed without the administration of intravenous contrast. Multiplanar reformatted images are provided for review. Automated exposure control, iterative reconstruction, and/or weight-based adjustment of the mA/kV was utilized to reduce the radiation dose to as low as reasonably achievable. COMPARISON: 07/08/2024 CLINICAL HISTORY: Pain with urination, abdominal pain, urinary retention. FINDINGS: LOWER CHEST: No acute abnormality. LIVER: The liver is unremarkable. GALLBLADDER AND BILE DUCTS: Status post cholecystectomy. No biliary ductal dilatation. SPLEEN: No acute abnormality. PANCREAS: No acute abnormality. ADRENAL GLANDS: No acute abnormality. KIDNEYS, URETERS AND BLADDER: Stable at 14 mm hyperdense exophytic lesion arises from the interval region of the right kidney laterally since remote prior examination of 06/04/2021 which is in keeping with a hyperdense cyst. No follow up imaging is recommended. Multiple nonobstructive 1 - 2 mm punctate renal calculi are again identified bilaterally,  unchanged from prior examination. There is interval development of bilateral urothelial thickening and periureteric inflammatory stranding as well as  development of mild hydronephrosis and progressive distention of the dilated right renal pelvis which may reflect changes of bilateral ascending urinary tract infection. There is progressive circumferential bladder wall thickening and subtle periarticular inflammatory stranding suggesting an underlying infectious or inflammatory cystitis. Correlation with urinalysis and urine culture may be helpful for further management. No ureteral calculi. No perinephric inflammatory stranding or fluid collection identified. GI AND BOWEL: Appendix normal. The stomach, small bowel, and large bowel are otherwise unremarkable. PERITONEUM AND RETROPERITONEUM: No ascites. No free air. VASCULATURE: Aorta is normal in caliber. Moderate aortoiliac atherosclerotic calcification. No aortic aneurysm. LYMPH NODES: No lymphadenopathy. REPRODUCTIVE ORGANS: Uterus absent. No adnexal masses. BONES AND SOFT TISSUES: Osseous structures are age appropriate. No acute bone abnormality. No lytic or blastic bone lesion. Small broad-based fat-containing umbilical hernia. No focal soft tissue abnormality. IMPRESSION: 1. Interval development of bilateral urothelial thickening with periureteric inflammatory stranding, mild hydronephrosis, and progressive distention of the dilated right renal pelvis, suggestive of bilateral ascending urinary tract infection; no ureteral calculi . no perinephric inflammatory stranding or fluid collection identified 2. Progressive circumferential bladder wall thickening with subtle perivesical inflammatory stranding, suggestive of infectious or inflammatory cystitis; correlation with urinalysis and urine culture may be helpful for further management 3. Stable 14 mm hyperdense exophytic right renal cyst, compatible with benign hyperdense cyst; no follow-up imaging recommended 4. Multiple bilateral nonobstructive punctate renal calculi, unchanged 5. raf score: Aortic atherosclerosis (icd10-i70.0) Electronically signed by:  Dorethia Molt MD 09/26/2024 10:32 PM EST RP Workstation: [id number]     Medical Consultants:   None.   Subjective:    Ashley Dean relates she feels better but continues to have dysuria.  Objective:    Vitals:   09/28/24 0129 09/28/24 0356 09/28/24 0358 09/28/24 0628  BP: (!) 97/47 (!) 98/48  (!) 101/57  Pulse: 74 80  78  Resp: 17 15    Temp:  97.8 F (36.6 C)    TempSrc:  Oral    SpO2:  100%    Weight:   60.2 kg   Height:       SpO2: 100 %   Intake/Output Summary (Last 24 hours) at 09/28/2024 0819 Last data filed at 09/28/2024 0813 Gross per 24 hour  Intake 2087.73 ml  Output 1575 ml  Net 512.73 ml   Filed Weights   09/27/24 1800 09/28/24 0358  Weight: 57.5 kg 60.2 kg    Exam: General exam: In no acute distress. Respiratory system: Good air movement and clear to auscultation. Cardiovascular system: S1 & S2 heard, RRR. No JVD. Gastrointestinal system: Abdomen is nondistended, soft and nontender.  Extremities: No pedal edema. Skin: No rashes, lesions or ulcers Psychiatry: Judgement and insight appear normal. Mood & affect appropriate.    Data Reviewed:    Labs: Basic Metabolic Panel: Recent Labs  Lab 09/26/24 2046 09/27/24 0416 09/28/24 0532  NA 134* 133* 136  K 5.7* 5.1 4.2  CL 102 103 102  CO2 14* 13* 21*  GLUCOSE 108* 103* 188*  BUN 114* 113* 102*  CREATININE 5.60* 5.07* 3.90*  CALCIUM 10.0 9.6 8.6*  MG  --  1.9  --   PHOS  --  7.7*  7.7* 4.7*   GFR Estimated Creatinine Clearance: 9.2 mL/min (A) (by C-G formula based on SCr of 3.9 mg/dL (H)). Liver Function Tests: Recent Labs  Lab 09/26/24 2046 09/27/24 0416 09/28/24 0532  AST  30  --   --   ALT 20  --   --   ALKPHOS 72  --   --   BILITOT 0.3  --   --   PROT 7.4  --   --   ALBUMIN  3.7 3.2* 3.0*   No results for input(s): LIPASE, AMYLASE in the last 168 hours. No results for input(s): AMMONIA in the last 168 hours. Coagulation profile No results for input(s): INR,  PROTIME in the last 168 hours. COVID-19 Labs  No results for input(s): DDIMER, FERRITIN, LDH, CRP in the last 72 hours.  No results found for: SARSCOV2NAA  CBC: Recent Labs  Lab 09/26/24 2046 09/27/24 0416 09/28/24 0532  WBC 11.4* 9.4 7.8  NEUTROABS 6.6  --   --   HGB 10.5* 9.0* 7.6*  HCT 32.0* 27.5* 22.4*  MCV 84.9 83.8 83.6  PLT 436* 360 300   Cardiac Enzymes: No results for input(s): CKTOTAL, CKMB, CKMBINDEX, TROPONINI in the last 168 hours. BNP (last 3 results) No results for input(s): PROBNP in the last 8760 hours. CBG: Recent Labs  Lab 09/27/24 0924 09/28/24 0734  GLUCAP 103* 159*   D-Dimer: No results for input(s): DDIMER in the last 72 hours. Hgb A1c: No results for input(s): HGBA1C in the last 72 hours. Lipid Profile: No results for input(s): CHOL, HDL, LDLCALC, TRIG, CHOLHDL, LDLDIRECT in the last 72 hours. Thyroid  function studies: No results for input(s): TSH, T4TOTAL, T3FREE, THYROIDAB in the last 72 hours.  Invalid input(s): FREET3 Anemia work up: No results for input(s): VITAMINB12, FOLATE, FERRITIN, TIBC, IRON, RETICCTPCT in the last 72 hours. Sepsis Labs: Recent Labs  Lab 09/26/24 2046 09/27/24 0416 09/28/24 0532  WBC 11.4* 9.4 7.8   Microbiology No results found for this or any previous visit (from the past 240 hours).   Medications:    heparin   5,000 Units Subcutaneous Q8H   rosuvastatin   10 mg Oral QHS   sodium chloride  flush  3 mL Intravenous Q12H   Continuous Infusions:  cefTRIAXone  (ROCEPHIN )  IV 2 g (09/28/24 0003)   sodium bicarbonate  150 mEq in dextrose  5 % 1,150 mL infusion 100 mL/hr at 09/28/24 0813      LOS: 1 day   Erle Odell Castor  [facility]  09/28/2024, 8:19 AM

## 2024-09-29 DIAGNOSIS — E875 Hyperkalemia: Secondary | ICD-10-CM | POA: Diagnosis not present

## 2024-09-29 DIAGNOSIS — N1831 Chronic kidney disease, stage 3a: Secondary | ICD-10-CM | POA: Diagnosis not present

## 2024-09-29 DIAGNOSIS — N179 Acute kidney failure, unspecified: Secondary | ICD-10-CM | POA: Diagnosis not present

## 2024-09-29 LAB — RENAL FUNCTION PANEL
Albumin: 3.1 g/dL — ABNORMAL LOW (ref 3.5–5.0)
Anion gap: 11 (ref 5–15)
BUN: 83 mg/dL — ABNORMAL HIGH (ref 8–23)
CO2: 22 mmol/L (ref 22–32)
Calcium: 8 mg/dL — ABNORMAL LOW (ref 8.9–10.3)
Chloride: 110 mmol/L (ref 98–111)
Creatinine, Ser: 2.74 mg/dL — ABNORMAL HIGH (ref 0.44–1.00)
GFR, Estimated: 17 mL/min — ABNORMAL LOW (ref >60)
Glucose, Bld: 105 mg/dL — ABNORMAL HIGH (ref 70–99)
Phosphorus: 3.1 mg/dL (ref 2.5–4.6)
Potassium: 4.5 mmol/L (ref 3.5–5.1)
Sodium: 143 mmol/L (ref 135–145)

## 2024-09-29 LAB — BPAM RBC
Blood Product Expiration Date: 202512212359
ISSUE DATE / TIME: 202511191435
Unit Type and Rh: 5100

## 2024-09-29 LAB — TYPE AND SCREEN
ABO/RH(D): O POS
Antibody Screen: NEGATIVE
Unit division: 0

## 2024-09-29 LAB — GLUCOSE, CAPILLARY: Glucose-Capillary: 100 mg/dL — ABNORMAL HIGH (ref 70–99)

## 2024-09-29 MED ORDER — HYDROCODONE-ACETAMINOPHEN 7.5-325 MG PO TABS
1.0000 | ORAL_TABLET | Freq: Four times a day (QID) | ORAL | Status: DC | PRN
  Administered 2024-09-29 – 2024-10-02 (×8): 1 via ORAL
  Filled 2024-09-29 (×8): qty 1

## 2024-09-29 MED ORDER — OXYBUTYNIN CHLORIDE 5 MG PO TABS: 2.5000 mg | ORAL_TABLET | Freq: Three times a day (TID) | ORAL | Status: DC

## 2024-09-29 NOTE — Evaluation (Signed)
 Occupational Therapy Evaluation Patient Details Name: Ashley Dean MRN: [id number] DOB: 12-18-1940 Today's Date: 09/29/2024   History of Present Illness   83 yr old female admitted with dysuria and found to have acute renal failure and acute pyelonephritis. PMH: essential HTN, DM, CKD, recurrent UTI, fibromyalgia, macular degeneration, osteopenia     Clinical Impressions The pt is currently presenting slightly below her baseline level of functioning for ADL management. She is limited by the below listed deficits (see OT problem list). At current, she requires SBA to CGA for tasks, including lower body dressing, sit to stand, and upper body grooming standing at the sink. She reported significant pelvic/bladder pressure and pain this session, compromising her overall activity tolerance. She also appeared to be with slight deconditioning and mild generalized weakness. She will benefit from further OT services to maximize her independence with ADLs and to facilitate her safe return home. No post-acute OT needs anticipated.      If plan is discharge home, recommend the following:   Help with stairs or ramp for entrance;Assist for transportation;Assistance with cooking/housework     Functional Status Assessment   Patient has had a recent decline in their functional status and demonstrates the ability to make significant improvements in function in a reasonable and predictable amount of time.     Equipment Recommendations   Tub/shower seat     Recommendations for Other Services         Precautions/Restrictions   Restrictions Weight Bearing Restrictions Per Provider Order: No     Mobility Bed Mobility Overal bed mobility: Needs Assistance Bed Mobility: Supine to Sit, Sit to Supine     Supine to sit: Supervision Sit to supine: Supervision        Transfers Overall transfer level: Needs assistance Equipment used: Rolling walker (2 wheels) Transfers: Sit to/from  Stand Sit to Stand: Contact guard assist, From elevated surface         Balance     Sitting balance-Leahy Scale: Good       Standing balance-Leahy Scale: Fair       ADL either performed or assessed with clinical judgement   ADL Overall ADL's : Needs assistance/impaired Eating/Feeding: Independent;Sitting   Grooming: Contact guard assist;Standing Grooming Details (indicate cue type and reason): she performed hand washing at the sink Upper Body Bathing: Set up;Sitting   Lower Body Bathing: Contact guard assist;Sit to/from stand   Upper Body Dressing : Set up;Sitting   Lower Body Dressing: Contact guard assist;Sit to/from stand   Toilet Transfer: Contact guard assist;Rolling walker (2 wheels);Ambulation   Toileting- Clothing Manipulation and Hygiene: Contact guard assist;Sit to/from stand               Vision   Additional Comments: She correctly read the time depicted on the wall clock.            Pertinent Vitals/Pain Pain Assessment Pain Assessment: 0-10 Pain Score: 10-Worst pain ever Pain Location: abdomen; bladder pressure Pain Intervention(s): Limited activity within patient's tolerance, Monitored during session, Repositioned     Extremity/Trunk Assessment Upper Extremity Assessment Upper Extremity Assessment: RUE deficits/detail;LUE deficits/detail RUE Deficits / Details: AROM WFL. Grip strength 4/5 LUE Deficits / Details: AROM WFL. Grip strength 4/5   Lower Extremity Assessment Lower Extremity Assessment: Generalized weakness;RLE deficits/detail;LLE deficits/detail RLE Deficits / Details: AROM WFL LLE Deficits / Details: AROM WFL       Communication Communication Factors Affecting Communication: Hearing impaired   Cognition Arousal: Alert Behavior During Therapy: WFL for tasks  assessed/performed               OT - Cognition Comments: Oriented x4          Following commands: Intact       Cueing  General Comments   Cueing  Techniques: Verbal cues              Home Living Family/patient expects to be discharged to:: Private residence Living Arrangements: Other relatives (79 yr old grandson) Available Help at Discharge: Family Type of Home: House       Home Layout: One level     Bathroom Shower/Tub: Walk-in shower         Home Equipment: Agricultural Consultant (2 wheels);Rollator (4 wheels);Cane - single point          Prior Functioning/Environment Prior Level of Function : Independent/Modified Independent;Driving             Mobility Comments: Independent with ambulation. ADLs Comments: Independent with ADLs, cooking, cleaning and driving.    OT Problem List: Decreased strength;Decreased knowledge of use of DME or AE;Pain   OT Treatment/Interventions: Self-care/ADL training;Therapeutic exercise;Therapeutic activities;Energy conservation;Patient/family education;DME and/or AE instruction;Balance training      OT Goals(Current goals can be found in the care plan section)   Acute Rehab OT Goals Patient Stated Goal: decreased pain OT Goal Formulation: With patient Time For Goal Achievement: 10/13/24 Potential to Achieve Goals: Good ADL Goals Pt Will Perform Grooming: with modified independence;standing Pt Will Perform Lower Body Dressing: with modified independence;sit to/from stand Pt Will Transfer to Toilet: with modified independence;ambulating Pt Will Perform Toileting - Clothing Manipulation and hygiene: with modified independence;sit to/from stand   OT Frequency:  Min 2X/week       AM-PAC OT 6 Clicks Daily Activity     Outcome Measure Help from another person eating meals?: None Help from another person taking care of personal grooming?: A Little Help from another person toileting, which includes using toliet, bedpan, or urinal?: A Little Help from another person bathing (including washing, rinsing, drying)?: A Little Help from another person to put on and taking off  regular upper body clothing?: None Help from another person to put on and taking off regular lower body clothing?: A Little 6 Click Score: 20   End of Session Equipment Utilized During Treatment: Rolling walker (2 wheels) Nurse Communication: Mobility status  Activity Tolerance: Patient limited by pain Patient left: in bed;with call bell/phone within reach;with bed alarm set;with family/visitor present  OT Visit Diagnosis: Muscle weakness (generalized) (M62.81);Pain Pain - part of body:  (bladder/pelvis)                Time: 8397-8380 OT Time Calculation (min): 17 min Charges:  OT General Charges $OT Visit: 1 Visit OT Evaluation $OT Eval Moderate Complexity: 1 Mod    Doran Nestle J Harris,OTR/L 09/29/2024, 5:10 PM

## 2024-09-29 NOTE — Progress Notes (Signed)
 [facility] PROGRESS NOTE    Progress Note  Ashley Dean  FMW:[id number] DOB: 04/16/1941 DOA: 09/26/2024 PCP: [name], MD     Brief Narrative:   Ashley Dean is an 83 y.o. female past medical history significant for essential hypertension, hyperlipidemia diabetes mellitus type 2, chronic kidney disease stage III AA presents with dysuria and ability to void.  She follows up with urology who did her ureteral cystoscopy did not find any intrinsic obstruction or hydronephrosis was attributed to her scoliosis.  In the ED was found to have creatinine of 5.6 (with a baseline creatinine around 1), UA were consistent with a urinary tract infection, CT scan of the abdomen pelvis showed bilateral ureteral thickening with stranding.  Also circumferential bladder wall thickening.  Urology was consulted  Assessment/Plan:   Acute renal failure (ARF) on chronic kidney disease stage IIIa: No significant obstruction seen on CT.  It is to note that she was previously on Bactrim Likely due to pyelonephritis. Continue to hold hydrochlorothiazide and ACE . Continue vancomycin and Rocephin . Urine cultures were inconclusive, but her urine was purulent recent urine cultures. This morning creatinine is 2.7. Continue current management.  Acute Pyelonephritis: Purulent urine with new acute kidney injury and borderline blood pressure. Continue IV vancomycin and Rocephin . Unfortunately blood cultures were not sent on admission. Her urine had heavy sediment it was cloudy. Urine culture was inconclusive repeat urine culture.  Hyperkalemia: Now resolved.  Normocytic anemia: Hemoglobin on admission was 10, starting IV fluid this morning 7.6. This post 1 unit packed red blood cells hemoglobin this morning is 8.7.  Hyperphosphatemia: Improving with IV fluids.  Essential hypertension All antihypertensive medications were held in the setting of borderline blood pressure. She was started on  midodrine her blood pressure is improved.  Hyperlipidemia Continue Crestor.  Diabetes mellitus type 2: Hold metformin continue sliding scale insulin   DVT prophylaxis: lovenox Family Communication:neon Status is: Inpatient Remains inpatient appropriate because: Acute kidney injury    Code Status:     Code Status Orders  (From admission, onward)           Start     Ordered   09/27/24 0013  Full code  Continuous       Question:  By:  Answer:  Consent: discussion documented in EHR   09/27/24 0014           Code Status History     This patient has a current code status but no historical code status.         IV Access:   Peripheral IV   Procedures and diagnostic studies:   No results found.    Medical Consultants:   None.   Subjective:    Ashley Dean continues to have dysuria and suprapubic pain  Objective:    Vitals:   09/29/24 0200 09/29/24 0300 09/29/24 0400 09/29/24 0456  BP: (!) 90/41 (!) 103/55 (!) 109/45   Pulse: 70 73 85   Resp: 16 (!) 22 15   Temp:    97.9 F (36.6 C)  TempSrc:    Oral  SpO2: 99% 97% 99%   Weight:      Height:       SpO2: 99 %   Intake/Output Summary (Last 24 hours) at 09/29/2024 0604 Last data filed at 09/29/2024 0456 Gross per 24 hour  Intake 4905.58 ml  Output 2225 ml  Net 2680.58 ml   Filed Weights   09/27/24 1800 09/28/24 0358  Weight: 57.5 kg 60.2  kg    Exam: General exam: In no acute distress. Respiratory system: Good air movement and clear to auscultation. Cardiovascular system: S1 & S2 heard, RRR. No JVD. Gastrointestinal system: Abdomen is nondistended, soft and nontender.  Extremities: No pedal edema. Skin: No rashes, lesions or ulcers Psychiatry: Judgement and insight appear normal. Mood & affect appropriate.   Data Reviewed:    Labs: Basic Metabolic Panel: Recent Labs  Lab 09/26/24 2046 09/27/24 0416 09/28/24 0532 09/29/24 0320  NA 134* 133* 136 143  K 5.7* 5.1 4.2  4.5  CL 102 103 102 110  CO2 14* 13* 21* 22  GLUCOSE 108* 103* 188* 105*  BUN 114* 113* 102* 83*  CREATININE 5.60* 5.07* 3.90* 2.74*  CALCIUM 10.0 9.6 8.6* 8.0*  MG  --  1.9  --   --   PHOS  --  7.7*  7.7* 4.7* 3.1   GFR Estimated Creatinine Clearance: 13.1 mL/min (A) (by C-G formula based on SCr of 2.74 mg/dL (H)). Liver Function Tests: Recent Labs  Lab 09/26/24 2046 09/27/24 0416 09/28/24 0532 09/29/24 0320  AST 30  --   --   --   ALT 20  --   --   --   ALKPHOS 72  --   --   --   BILITOT 0.3  --   --   --   PROT 7.4  --   --   --   ALBUMIN 3.7 3.2* 3.0* 3.1*   No results for input(s): LIPASE, AMYLASE in the last 168 hours. No results for input(s): AMMONIA in the last 168 hours. Coagulation profile No results for input(s): INR, PROTIME in the last 168 hours. COVID-19 Labs  No results for input(s): DDIMER, FERRITIN, LDH, CRP in the last 72 hours.  No results found for: SARSCOV2NAA  CBC: Recent Labs  Lab 09/26/24 2046 09/27/24 0416 09/28/24 0532 09/28/24 1813  WBC 11.4* 9.4 7.8 8.2  NEUTROABS 6.6  --   --   --   HGB 10.5* 9.0* 7.6* 8.7*  HCT 32.0* 27.5* 22.4* 26.6*  MCV 84.9 83.8 83.6 86.4  PLT 436* 360 300 266   Cardiac Enzymes: No results for input(s): CKTOTAL, CKMB, CKMBINDEX, TROPONINI in the last 168 hours. BNP (last 3 results) No results for input(s): PROBNP in the last 8760 hours. CBG: Recent Labs  Lab 09/27/24 0924 09/28/24 0734  GLUCAP 103* 159*   D-Dimer: No results for input(s): DDIMER in the last 72 hours. Hgb A1c: No results for input(s): HGBA1C in the last 72 hours. Lipid Profile: No results for input(s): CHOL, HDL, LDLCALC, TRIG, CHOLHDL, LDLDIRECT in the last 72 hours. Thyroid  function studies: No results for input(s): TSH, T4TOTAL, T3FREE, THYROIDAB in the last 72 hours.  Invalid input(s): FREET3 Anemia work up: No results for input(s): VITAMINB12, FOLATE, FERRITIN,  TIBC, IRON, RETICCTPCT in the last 72 hours. Sepsis Labs: Recent Labs  Lab 09/26/24 2046 09/27/24 0416 09/28/24 0532 09/28/24 1813 09/28/24 2144  WBC 11.4* 9.4 7.8 8.2  --   LATICACIDVEN  --   --   --   --  1.3   Microbiology Recent Results (from the past 240 hours)  Urine Culture     Status: Abnormal   Collection Time: 09/26/24 11:45 PM   Specimen: Urine, Clean Catch  Result Value Ref Range Status   Specimen Description   Final    URINE, CLEAN CATCH Performed at [facility], [address], [place], [place] [address]    Special Requests  Final    NONE Performed at [facility], [address], [place], [place] [address]    Culture MULTIPLE SPECIES PRESENT, SUGGEST RECOLLECTION (A)  Final   Report Status 09/28/2024 FINAL  Final  MRSA Next Gen by PCR, Nasal     Status: None   Collection Time: 09/28/24  7:08 PM   Specimen: Nasal Mucosa; Nasal Swab  Result Value Ref Range Status   MRSA by PCR Next Gen NOT DETECTED NOT DETECTED Final    Comment: (NOTE) The GeneXpert MRSA Assay (FDA approved for NASAL specimens only), is one component of a comprehensive MRSA colonization surveillance program. It is not intended to diagnose MRSA infection nor to guide or monitor treatment for MRSA infections. Test performance is not FDA approved in patients less than 67 years old. Performed at [facility], [address], [place], [place] [address]      Medications:    Chlorhexidine  Gluconate Cloth  6 each Topical Daily   heparin   5,000 Units Subcutaneous Q8H   midodrine   10 mg Oral TID WC   rosuvastatin   10 mg Oral QHS   Continuous Infusions:  cefTRIAXone  (ROCEPHIN )  IV Stopped (09/28/24 2336)      LOS: 2 days   Erle Odell Castor  [facility]  09/29/2024, 6:04 AM

## 2024-09-29 NOTE — Plan of Care (Signed)
 plan of care and goals reviewed with patient and family, time given for questions, patient handbook/guide at bedside, patient has dentures, hearing aid and glasses at bedside,foley catheter flushed and flowing well,BP remains low, on call provider made aware, see chart for new orders, bed in lowest locked position, mat on floor with bed alarm on, call bell in reach and side rails up.  Problem: Education: Goal: Knowledge of General Education information will improve Description: Including pain rating scale, medication(s)/side effects and non-pharmacologic comfort measures Outcome: Progressing   Problem: Clinical Measurements: Goal: Ability to maintain clinical measurements within normal limits will improve Outcome: Progressing Goal: Will remain free from infection Outcome: Progressing Goal: Diagnostic test results will improve Outcome: Progressing Goal: Respiratory complications will improve Outcome: Progressing Goal: Cardiovascular complication will be avoided Outcome: Progressing   Problem: Elimination: Goal: Will not experience complications related to bowel motility Outcome: Progressing Goal: Will not experience complications related to urinary retention Outcome: Progressing   Problem: Pain Managment: Goal: General experience of comfort will improve and/or be controlled Outcome: Progressing   Problem: Safety: Goal: Ability to remain free from injury will improve Outcome: Progressing

## 2024-09-29 NOTE — Progress Notes (Signed)
  Subjective: Pt resting in bed.  C/o lower abdominal pain and urinary urgency.  Daughter in-law at bedside  Objective: Vital signs in last 24 hours: Temp:  [97.8 F (36.6 C)-99.8 F (37.7 C)] 97.8 F (36.6 C) (11/20 1537) Pulse Rate:  [57-93] 57 (11/20 1537) Resp:  [12-25] 18 (11/20 1537) BP: (79-130)/(29-105) 100/47 (11/20 1537) SpO2:  [71 %-100 %] 95 % (11/20 1400)  Intake/Output from previous day: 11/19 0701 - 11/20 0700 In: 5108.5 [P.O.:1500; I.V.:1824.7; Blood:317.2; IV Piggyback:1366.7] Out: 2875 [Urine:2875]  Intake/Output this shift: Total I/O In: 200 [Other:200] Out: 650 [Urine:650]  Physical Exam:  General: Alert and oriented CV: RRR, palpable distal pulses Lungs: CTAB, equal chest rise Abdomen: Soft, NTND, no rebound or guarding Gu: Foley in place and draining cloudy urine Ext: NT, No erythema  Lab Results: Recent Labs    09/27/24 0416 09/28/24 0532 09/28/24 1813  HGB 9.0* 7.6* 8.7*  HCT 27.5* 22.4* 26.6*   BMET Recent Labs    09/28/24 0532 09/29/24 0320  NA 136 143  K 4.2 4.5  CL 102 110  CO2 21* 22  GLUCOSE 188* 105*  BUN 102* 83*  CREATININE 3.90* 2.74*  CALCIUM 8.6* 8.0*     Studies/Results: No results found.  Assessment/Plan: Ashley Dean is a 83 y.o. female with chronic cystitis, right UPJ obstruction and ARF  -Renal function improving with IVF and pRBC transfusion.  UOP appropriate.  Keep Foley in place for now.  PO fluid intake encouraged.  Will re-assess her renal function tomorrow.  If her renal function declines off of IVF, will consider replacing a right ureteral stent. -Continue empiric abx for suspected UTI   LOS: 2 days   Lonni Han, MD [facility] Pager: [phone]  09/29/2024, 3:59 PM

## 2024-09-30 DIAGNOSIS — N1831 Chronic kidney disease, stage 3a: Secondary | ICD-10-CM | POA: Diagnosis not present

## 2024-09-30 DIAGNOSIS — E875 Hyperkalemia: Secondary | ICD-10-CM | POA: Diagnosis not present

## 2024-09-30 DIAGNOSIS — N179 Acute kidney failure, unspecified: Secondary | ICD-10-CM | POA: Diagnosis not present

## 2024-09-30 LAB — RENAL FUNCTION PANEL
Albumin: 3.4 g/dL — ABNORMAL LOW (ref 3.5–5.0)
Anion gap: 12 (ref 5–15)
BUN: 73 mg/dL — ABNORMAL HIGH (ref 8–23)
CO2: 22 mmol/L (ref 22–32)
Calcium: 8.7 mg/dL — ABNORMAL LOW (ref 8.9–10.3)
Chloride: 108 mmol/L (ref 98–111)
Creatinine, Ser: 2.4 mg/dL — ABNORMAL HIGH (ref 0.44–1.00)
GFR, Estimated: 20 mL/min — ABNORMAL LOW (ref >60)
Glucose, Bld: 92 mg/dL (ref 70–99)
Phosphorus: 2.8 mg/dL (ref 2.5–4.6)
Potassium: 4.6 mmol/L (ref 3.5–5.1)
Sodium: 141 mmol/L (ref 135–145)

## 2024-09-30 LAB — GLUCOSE, CAPILLARY: Glucose-Capillary: 91 mg/dL (ref 70–99)

## 2024-09-30 MED ORDER — LOPERAMIDE HCL 2 MG PO CAPS
2.0000 mg | ORAL_CAPSULE | ORAL | Status: AC | PRN
  Administered 2024-09-30 (×2): 2 mg via ORAL
  Filled 2024-09-30 (×2): qty 1

## 2024-09-30 MED ORDER — DOXYCYCLINE HYCLATE 100 MG PO TABS
100.0000 mg | ORAL_TABLET | Freq: Two times a day (BID) | ORAL | Status: DC
  Administered 2024-09-30 (×2): 100 mg via ORAL
  Filled 2024-09-30 (×2): qty 1

## 2024-09-30 MED ORDER — ONDANSETRON HCL 4 MG/2ML IJ SOLN
4.0000 mg | Freq: Four times a day (QID) | INTRAMUSCULAR | Status: DC | PRN
  Administered 2024-09-30: 4 mg via INTRAVENOUS
  Filled 2024-09-30: qty 2

## 2024-09-30 MED ORDER — MIDODRINE HCL 5 MG PO TABS
2.5000 mg | ORAL_TABLET | Freq: Three times a day (TID) | ORAL | Status: DC
  Administered 2024-09-30 (×2): 2.5 mg via ORAL
  Filled 2024-09-30: qty 1

## 2024-09-30 NOTE — Progress Notes (Signed)
     Subjective: No acute events overnight. Foley removed this am. Pt feels urge to urinate immediately thereafter. Will have nursing scan in regular intervals  Objective: Vital signs in last 24 hours: Temp:  [97.8 F (36.6 C)-98.5 F (36.9 C)] 98.1 F (36.7 C) (11/21 0509) Pulse Rate:  [57-77] 65 (11/21 0509) Resp:  [15-19] 17 (11/21 0509) BP: (100-130)/(47-105) 118/53 (11/21 0509) SpO2:  [95 %-99 %] 98 % (11/21 0509)  Assessment/Plan: # UTI # Right UPJ obstruction # ARF  Interval improvement in serum creatinine off of IV fluids.  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  Holding off on ureteral stent for now  Urinalysis contaminated with multiple species.  Unlikely to get beneficial sample after multiple doses of ABX.  Empiric ceftriaxone   Urology will follow peripherally  Intake/Output from previous day: 11/20 0701 - 11/21 0700 In: 1040 [P.O.:840] Out: 1950 [Urine:1950]  Intake/Output this shift: No intake/output data recorded.  Physical Exam:  General: Alert and oriented CV: No cyanosis Lungs: equal chest rise Gu: Foley catheter removed  Lab Results: Recent Labs    09/28/24 0532 09/28/24 1813  HGB 7.6* 8.7*  HCT 22.4* 26.6*   BMET Recent Labs    09/28/24 0532 09/28/24 1813 09/29/24 0320 09/30/24 0612  NA 136  --  143 141  K 4.2  --  4.5 4.6  CL 102  --  110 108  CO2 21*  --  22 22  GLUCOSE 188*  --  105* 92  BUN 102*  --  83* 73*  CREATININE 3.90*  --  2.74* 2.40*  CALCIUM  8.6*  --  8.0* 8.7*  HGB 7.6* 8.7*  --   --   WBC 7.8 8.2  --   --      Studies/Results: No results found.     LOS: 3 days   Ole Bourdon, NP [facility] Pager: [phone]  09/30/2024, 9:02 AM

## 2024-09-30 NOTE — Progress Notes (Signed)
 Physical Therapy Treatment Patient Details Name: Ashley Dean MRN: [id number] DOB: [birth year] Today's Date: 09/30/2024   History of Present Illness 83 yo female admitted with ARF. Hx of essential HTN, DM, CKD, recurrent UTI, fibromyalgia, macular degeneration, osteopenia    PT Comments  Patient progressing with mobility and able to walk full length of hallway.  Slow and pausing at times though not fatigued.  States can get a walker though feels may benefit from 3:1.  Patient appropriate for home when stable to HHPT follow up.     If plan is discharge home, recommend the following: A little help with walking and/or transfers;A little help with bathing/dressing/bathroom;Assistance with cooking/housework;Assist for transportation;Help with stairs or ramp for entrance   Can travel by private vehicle        Equipment Recommendations  None recommended by PT    Recommendations for Other Services       Precautions / Restrictions Precautions Precautions: Fall Recall of Precautions/Restrictions: Intact     Mobility  Bed Mobility Overal bed mobility: Needs Assistance Bed Mobility: Supine to Sit, Sit to Supine     Supine to sit: Supervision Sit to supine: Min assist   General bed mobility comments: Assist for LEs back onto bed. Increased time. Cues for safety    Transfers Overall transfer level: Needs assistance Equipment used: Rolling walker (2 wheels) Transfers: Sit to/from Stand Sit to Stand: Contact guard assist           General transfer comment: cues for hand placement; gingerly transitions on and off EOB and toilet due to feeling like something sharp sticking up in me    Ambulation/Gait Ambulation/Gait assistance: Contact guard assist, Supervision Gait Distance (Feet): 400 Feet Assistive device: Rolling walker (2 wheels) Gait Pattern/deviations: Trunk flexed       General Gait Details: mild trunk flexion; slow steady pace in hallway   Stairs              Wheelchair Mobility     Tilt Bed    Modified Rankin (Stroke Patients Only)       Balance Overall balance assessment: Needs assistance Sitting-balance support: Feet supported Sitting balance-Leahy Scale: Good     Standing balance support: Bilateral upper extremity supported, No upper extremity supported, During functional activity Standing balance-Leahy Scale: Fair Standing balance comment: washing hands in sink after toileting                            Communication Communication Communication: Impaired Factors Affecting Communication: Hearing impaired  Cognition Arousal: Alert Behavior During Therapy: WFL for tasks assessed/performed   PT - Cognitive impairments: No apparent impairments                         Following commands: Intact      Cueing Cueing Techniques: Verbal cues  Exercises      General Comments General comments (skin integrity, edema, etc.): family in the room and supportive; states has rollator a friend borrowed though also a regular RW in her shed at home, family report they have one as well and easliy accessed.      Pertinent Vitals/Pain Pain Assessment Faces Pain Scale: Hurts whole lot Pain Location: abdomen; bladder pressure Pain Descriptors / Indicators: Discomfort, Grimacing Pain Intervention(s): Repositioned, Monitored during session    Home Living  Prior Function            PT Goals (current goals can now be found in the care plan section) Progress towards PT goals: Progressing toward goals    Frequency    Min 3X/week      PT Plan      Co-evaluation              AM-PAC PT 6 Clicks Mobility   Outcome Measure  Help needed turning from your back to your side while in a flat bed without using bedrails?: A Little Help needed moving from lying on your back to sitting on the side of a flat bed without using bedrails?: A Little Help needed moving to and  from a bed to a chair (including a wheelchair)?: A Little Help needed standing up from a chair using your arms (e.g., wheelchair or bedside chair)?: A Little Help needed to walk in hospital room?: A Little Help needed climbing 3-5 steps with a railing? : A Little 6 Click Score: 18    End of Session Equipment Utilized During Treatment: Gait belt Activity Tolerance: Patient tolerated treatment well Patient left: in bed;with call bell/phone within reach;with family/visitor present   PT Visit Diagnosis: Pain;Muscle weakness (generalized) (M62.81);Difficulty in walking, not elsewhere classified (R26.2);History of falling (Z91.81);Unsteadiness on feet (R26.81)     Time: 1427-1500 PT Time Calculation (min) (ACUTE ONLY): 33 min  Charges:    $Gait Training: 8-22 mins $Therapeutic Activity: 8-22 mins PT General Charges $$ ACUTE PT VISIT: 1 Visit                     Micheline Dean, PT Acute Rehabilitation Services Office:[phone] 09/30/2024    Ashley Dean 09/30/2024, 4:32 PM

## 2024-09-30 NOTE — Plan of Care (Signed)

## 2024-09-30 NOTE — Plan of Care (Signed)

## 2024-09-30 NOTE — Progress Notes (Signed)
 Patient has been ambulating to the restroom since Foley has been removed. UOP has been  >800. Bladder scan showed 63mL in bladder. Patient had emesis x1 likely related to oral doxycline. Zofran  given.

## 2024-09-30 NOTE — Progress Notes (Signed)
 [facility] PROGRESS NOTE    Progress Note  Ashley Dean  FMW:[id number] DOB: 1940/12/18 DOA: 09/26/2024 PCP: [name], MD     Brief Narrative:   Ashley Dean is an 83 y.o. female past medical history significant for essential hypertension, hyperlipidemia diabetes mellitus type 2, chronic kidney disease stage III AA presents with dysuria and ability to void.  She follows up with urology who did her ureteral cystoscopy did not find any intrinsic obstruction or hydronephrosis was attributed to her scoliosis.  In the ED was found to have creatinine of 5.6 (with a baseline creatinine around 1), UA were consistent with a urinary tract infection, CT scan of the abdomen pelvis showed bilateral ureteral thickening with stranding.  Also circumferential bladder wall thickening.  Urology was consulted  Assessment/Plan:   Acute renal failure (ARF) on chronic kidney disease stage IIIa: No significant obstruction seen on CT.  Baseline 1.0. Continue to hold hydrochlorothiazide and ACE . This morning creatinine is 2.4. Continue current management.  Sepsis due to acute Pyelonephritis: Purulent urine with new acute kidney injury and borderline blood pressure. Transition antibiotics to oral Augmentin  and doxycycline . Unfortunately blood cultures were not sent on admission.  Will need 2 weeks of antibiotics. Initial urine culture was inconclusive. Repeat a urine cultures are pending.  Hyperkalemia: Now resolved.  Normocytic anemia: Hemoglobin on admission was 10, starting IV fluid this morning 7.6. This post 1 unit packed red blood cells hemoglobin this morning is 8.7.  Hyperphosphatemia: Improving with IV fluids.  Essential hypertension All antihypertensive medications were held in the setting of borderline blood pressure. Blood pressure is improved, titrate down midodrine .  Hyperlipidemia Continue Crestor .  Diabetes mellitus type 2: Hold metformin continue sliding scale  insulin   DVT prophylaxis: lovenox Family Communication:neon Status is: Inpatient Remains inpatient appropriate because: Acute kidney injury    Code Status:     Code Status Orders  (From admission, onward)           Start     Ordered   09/27/24 0013  Full code  Continuous       Question:  By:  Answer:  Consent: discussion documented in EHR   09/27/24 0014           Code Status History     This patient has a current code status but no historical code status.         IV Access:   Peripheral IV   Procedures and diagnostic studies:   No results found.    Medical Consultants:   None.   Subjective:    [name] suprapubic  Pain and dysuria have resolved.  Objective:    Vitals:   09/29/24 1400 09/29/24 1537 09/29/24 2100 09/30/24 0509  BP: 130/67 (!) 100/47 (!) 130/49 (!) 118/53  Pulse: 64 (!) 57 (!) 59 65  Resp: 15 18 18 17   Temp:  97.8 F (36.6 C) 98.5 F (36.9 C) 98.1 F (36.7 C)  TempSrc:      SpO2: 95%  99% 98%  Weight:      Height:       SpO2: 98 %   Intake/Output Summary (Last 24 hours) at 09/30/2024 0828 Last data filed at 09/30/2024 0700 Gross per 24 hour  Intake 1040 ml  Output 1950 ml  Net -910 ml   Filed Weights   09/27/24 1800 09/28/24 0358  Weight: 57.5 kg 60.2 kg    Exam: General exam: In no acute distress. Respiratory system: Good air movement  and clear to auscultation. Cardiovascular system: S1 & S2 heard, RRR. No JVD. Gastrointestinal system: Abdomen is nondistended, soft and nontender.  Extremities: No pedal edema. Skin: No rashes, lesions or ulcers Psychiatry: Judgement and insight appear normal. Mood & affect appropriate.  Data Reviewed:    Labs: Basic Metabolic Panel: Recent Labs  Lab 09/26/24 2046 09/27/24 0416 09/28/24 0532 09/29/24 0320 09/30/24 0612  NA 134* 133* 136 143 141  K 5.7* 5.1 4.2 4.5 4.6  CL 102 103 102 110 108  CO2 14* 13* 21* 22 22  GLUCOSE 108* 103* 188* 105* 92   BUN 114* 113* 102* 83* 73*  CREATININE 5.60* 5.07* 3.90* 2.74* 2.40*  CALCIUM  10.0 9.6 8.6* 8.0* 8.7*  MG  --  1.9  --   --   --   PHOS  --  7.7*  7.7* 4.7* 3.1 2.8   GFR Estimated Creatinine Clearance: 14.9 mL/min (A) (by C-G formula based on SCr of 2.4 mg/dL (H)). Liver Function Tests: Recent Labs  Lab 09/26/24 2046 09/27/24 0416 09/28/24 0532 09/29/24 0320 09/30/24 0612  AST 30  --   --   --   --   ALT 20  --   --   --   --   ALKPHOS 72  --   --   --   --   BILITOT 0.3  --   --   --   --   PROT 7.4  --   --   --   --   ALBUMIN  3.7 3.2* 3.0* 3.1* 3.4*   No results for input(s): LIPASE, AMYLASE in the last 168 hours. No results for input(s): AMMONIA in the last 168 hours. Coagulation profile No results for input(s): INR, PROTIME in the last 168 hours. COVID-19 Labs  No results for input(s): DDIMER, FERRITIN, LDH, CRP in the last 72 hours.  No results found for: SARSCOV2NAA  CBC: Recent Labs  Lab 09/26/24 2046 09/27/24 0416 09/28/24 0532 09/28/24 1813  WBC 11.4* 9.4 7.8 8.2  NEUTROABS 6.6  --   --   --   HGB 10.5* 9.0* 7.6* 8.7*  HCT 32.0* 27.5* 22.4* 26.6*  MCV 84.9 83.8 83.6 86.4  PLT 436* 360 300 266   Cardiac Enzymes: No results for input(s): CKTOTAL, CKMB, CKMBINDEX, TROPONINI in the last 168 hours. BNP (last 3 results) No results for input(s): PROBNP in the last 8760 hours. CBG: Recent Labs  Lab 09/27/24 0924 09/28/24 0734 09/29/24 0754 09/30/24 0735  GLUCAP 103* 159* 100* 91   D-Dimer: No results for input(s): DDIMER in the last 72 hours. Hgb A1c: No results for input(s): HGBA1C in the last 72 hours. Lipid Profile: No results for input(s): CHOL, HDL, LDLCALC, TRIG, CHOLHDL, LDLDIRECT in the last 72 hours. Thyroid  function studies: No results for input(s): TSH, T4TOTAL, T3FREE, THYROIDAB in the last 72 hours.  Invalid input(s): FREET3 Anemia work up: No results for input(s):  VITAMINB12, FOLATE, FERRITIN, TIBC, IRON, RETICCTPCT in the last 72 hours. Sepsis Labs: Recent Labs  Lab 09/26/24 2046 09/27/24 0416 09/28/24 0532 09/28/24 1813 09/28/24 2144  WBC 11.4* 9.4 7.8 8.2  --   LATICACIDVEN  --   --   --   --  1.3   Microbiology Recent Results (from the past 240 hours)  Urine Culture     Status: Abnormal   Collection Time: 09/26/24 11:45 PM   Specimen: Urine, Clean Catch  Result Value Ref Range Status   Specimen Description   Final  URINE, CLEAN CATCH Performed at [facility], [address], [place], [place] [address]    Special Requests   Final    NONE Performed at [facility], [address], [place], [place] [address]    Culture MULTIPLE SPECIES PRESENT, SUGGEST RECOLLECTION (A)  Final   Report Status 09/28/2024 FINAL  Final  MRSA Next Gen by PCR, Nasal     Status: None   Collection Time: 09/28/24  7:08 PM   Specimen: Nasal Mucosa; Nasal Swab  Result Value Ref Range Status   MRSA by PCR Next Gen NOT DETECTED NOT DETECTED Final    Comment: (NOTE) The GeneXpert MRSA Assay (FDA approved for NASAL specimens only), is one component of a comprehensive MRSA colonization surveillance program. It is not intended to diagnose MRSA infection nor to guide or monitor treatment for MRSA infections. Test performance is not FDA approved in patients less than 6 years old. Performed at [facility], [address], [place], [place] [address]      Medications:    Chlorhexidine  Gluconate Cloth  6 each Topical Daily   heparin   5,000 Units Subcutaneous Q8H   midodrine   10 mg Oral TID WC   rosuvastatin   10 mg Oral QHS   Continuous Infusions:  cefTRIAXone  (ROCEPHIN )  IV 2 g (09/30/24 0017)      LOS: 3 days   Erle Odell Castor  [facility]  09/30/2024, 8:28 AM

## 2024-10-01 DIAGNOSIS — E875 Hyperkalemia: Secondary | ICD-10-CM | POA: Diagnosis not present

## 2024-10-01 DIAGNOSIS — N179 Acute kidney failure, unspecified: Secondary | ICD-10-CM | POA: Diagnosis not present

## 2024-10-01 DIAGNOSIS — N1831 Chronic kidney disease, stage 3a: Secondary | ICD-10-CM | POA: Diagnosis not present

## 2024-10-01 LAB — URINE CULTURE: Culture: >=100000 — AB

## 2024-10-01 LAB — RENAL FUNCTION PANEL
Albumin: 3.7 g/dL (ref 3.5–5.0)
Anion gap: 13 (ref 5–15)
BUN: 62 mg/dL — ABNORMAL HIGH (ref 8–23)
CO2: 21 mmol/L — ABNORMAL LOW (ref 22–32)
Calcium: 8.8 mg/dL — ABNORMAL LOW (ref 8.9–10.3)
Chloride: 107 mmol/L (ref 98–111)
Creatinine, Ser: 2.38 mg/dL — ABNORMAL HIGH (ref 0.44–1.00)
GFR, Estimated: 20 mL/min — ABNORMAL LOW (ref >60)
Glucose, Bld: 98 mg/dL (ref 70–99)
Phosphorus: 2.3 mg/dL — ABNORMAL LOW (ref 2.5–4.6)
Potassium: 4.7 mmol/L (ref 3.5–5.1)
Sodium: 140 mmol/L (ref 135–145)

## 2024-10-01 LAB — GLUCOSE, CAPILLARY: Glucose-Capillary: 127 mg/dL — ABNORMAL HIGH (ref 70–99)

## 2024-10-01 MED ORDER — MIDODRINE HCL 5 MG PO TABS
2.5000 mg | ORAL_TABLET | Freq: Two times a day (BID) | ORAL | Status: DC
  Administered 2024-10-01: 2.5 mg via ORAL
  Filled 2024-10-01: qty 1

## 2024-10-01 MED ORDER — LOPERAMIDE HCL 2 MG PO CAPS
2.0000 mg | ORAL_CAPSULE | ORAL | Status: AC | PRN
  Administered 2024-10-01 (×2): 2 mg via ORAL
  Filled 2024-10-01: qty 1

## 2024-10-01 MED ORDER — AMOXICILLIN-POT CLAVULANATE 875-125 MG PO TABS
1.0000 | ORAL_TABLET | Freq: Two times a day (BID) | ORAL | Status: DC
  Administered 2024-10-01: 1 via ORAL
  Filled 2024-10-01: qty 1

## 2024-10-01 MED ORDER — SACCHAROMYCES BOULARDII 250 MG PO CAPS
250.0000 mg | ORAL_CAPSULE | Freq: Two times a day (BID) | ORAL | Status: DC
  Administered 2024-10-01 – 2024-10-02 (×3): 250 mg via ORAL
  Filled 2024-10-01 (×3): qty 1

## 2024-10-01 MED ORDER — PSYLLIUM 95 % PO PACK
1.0000 | PACK | Freq: Every day | ORAL | Status: DC
  Administered 2024-10-01 – 2024-10-02 (×2): 1 via ORAL
  Filled 2024-10-01 (×2): qty 1

## 2024-10-01 MED ORDER — SODIUM CHLORIDE 0.9 % IV SOLN: INTRAVENOUS | Status: DC

## 2024-10-01 MED ORDER — AMOXICILLIN-POT CLAVULANATE 500-125 MG PO TABS
1.0000 | ORAL_TABLET | Freq: Two times a day (BID) | ORAL | Status: DC
  Administered 2024-10-01 – 2024-10-02 (×2): 1 via ORAL
  Filled 2024-10-01 (×2): qty 1

## 2024-10-01 NOTE — Progress Notes (Signed)
 Patient is reporting pain in her pelvis 8 out of 10. She said it is the same kind of pain that brought her to the hospital. The patient had a small amount of blood in her urine once. The next time she urinated there was no blood. Notified MD.

## 2024-10-01 NOTE — Progress Notes (Signed)
 Mobility Specialist Progress Note:   10/01/24 1220  Mobility  Activity Ambulated with assistance  Level of Assistance Minimal assist, patient does 75% or more  Assistive Device Front wheel walker  Distance Ambulated (ft) 170 ft  Activity Response Tolerated well  Mobility Referral Yes  Mobility visit 1 Mobility  Mobility Specialist Start Time (ACUTE ONLY) 1158  Mobility Specialist Stop Time (ACUTE ONLY) 1212  Mobility Specialist Time Calculation (min) (ACUTE ONLY) 14 min   Pt was received in recliner and agreed to mobility. Min A sit to stand. Pt stated pain in abdomen region but continued ambulation. Returned to restroom for a BM, water was bloody. RN notified. Returned to recliner with all needs met. Left in room with family.  [employer] - Mobility Specialist

## 2024-10-01 NOTE — Progress Notes (Signed)
 [facility] PROGRESS NOTE    Progress Note  Ashley Dean  FMW:[id number] DOB: 01-08-1941 DOA: 09/26/2024 PCP: [name], MD     Brief Narrative:   Ashley Dean is an 83 y.o. female past medical history significant for essential hypertension, hyperlipidemia diabetes mellitus type 2, chronic kidney disease stage III AA presents with dysuria and ability to void.  She follows up with urology who did her ureteral cystoscopy did not find any intrinsic obstruction or hydronephrosis was attributed to her scoliosis.  In the ED was found to have creatinine of 5.6 (with a baseline creatinine around 1), UA were consistent with a urinary tract infection, CT scan of the abdomen pelvis showed bilateral ureteral thickening with stranding.  Also circumferential bladder wall thickening.  Urology was consulted  Assessment/Plan:   Acute renal failure (ARF) on chronic kidney disease stage IIIa: No significant obstruction seen on CT.  Baseline creatinine around 1.0.  Urology was consulted as she is not a surgical candidate for chronic UPJ obstruction. Continue to hold hydrochlorothiazide and ACE . This morning creatinine is 2.3. Continue current management.  Sepsis due to acute Pyelonephritis: Purulent urine with new acute kidney injury and borderline blood pressure. Transition to oral Augmentin . Will need 2 weeks of antibiotics. Initial urine culture was inconclusive. Repeat a urine cultures are pending. Continue to wean down midodrine .  Hyperkalemia: Now resolved.  Normocytic anemia: Hemoglobin on admission was 10. This post 1 unit packed red blood cells, hemoglobin is stable.  Hyperphosphatemia: Improving with IV fluids.  Essential hypertension All antihypertensive medications were held in the setting of borderline blood pressure. Blood pressure is improved, titrate down midodrine .  Hyperlipidemia Continue Crestor .  Diabetes mellitus type 2: Hold metformin continue sliding  scale insulin   DVT prophylaxis: lovenox Family Communication:neon Status is: Inpatient Remains inpatient appropriate because: Acute kidney injury    Code Status:     Code Status Orders  (From admission, onward)           Start     Ordered   09/27/24 0013  Full code  Continuous       Question:  By:  Answer:  Consent: discussion documented in EHR   09/27/24 0014           Code Status History     This patient has a current code status but no historical code status.         IV Access:   Peripheral IV   Procedures and diagnostic studies:   No results found.    Medical Consultants:   None.   Subjective:    Ashley Dean suprapubic pain has resolved.  Objective:    Vitals:   09/30/24 1214 09/30/24 1937 10/01/24 0502 10/01/24 0521  BP: (!) 115/47 132/65 114/63 124/67  Pulse: 75 83 80 81  Resp:  15 20 18   Temp: 98 F (36.7 C) 98.3 F (36.8 C) 98.1 F (36.7 C) 98 F (36.7 C)  TempSrc: Oral     SpO2: 100% 98% 98% 98%  Weight:      Height:       SpO2: 98 %   Intake/Output Summary (Last 24 hours) at 10/01/2024 0850 Last data filed at 10/01/2024 0500 Gross per 24 hour  Intake --  Output 1200 ml  Net -1200 ml   Filed Weights   09/27/24 1800 09/28/24 0358  Weight: 57.5 kg 60.2 kg    Exam: General exam: In no acute distress. Respiratory system: Good air movement and clear to  auscultation. Cardiovascular system: S1 & S2 heard, RRR. No JVD. Gastrointestinal system: Abdomen is nondistended, soft and nontender.  Extremities: No pedal edema. Skin: No rashes, lesions or ulcers Psychiatry: Judgement and insight appear normal. Mood & affect appropriate.  Data Reviewed:    Labs: Basic Metabolic Panel: Recent Labs  Lab 09/27/24 0416 09/28/24 0532 09/29/24 0320 09/30/24 0612 10/01/24 0645  NA 133* 136 143 141 140  K 5.1 4.2 4.5 4.6 4.7  CL 103 102 110 108 107  CO2 13* 21* 22 22 21*  GLUCOSE 103* 188* 105* 92 98  BUN 113* 102*  83* 73* 62*  CREATININE 5.07* 3.90* 2.74* 2.40* 2.38*  CALCIUM  9.6 8.6* 8.0* 8.7* 8.8*  MG 1.9  --   --   --   --   PHOS 7.7*  7.7* 4.7* 3.1 2.8 2.3*   GFR Estimated Creatinine Clearance: 15.1 mL/min (A) (by C-G formula based on SCr of 2.38 mg/dL (H)). Liver Function Tests: Recent Labs  Lab 09/26/24 2046 09/27/24 0416 09/28/24 0532 09/29/24 0320 09/30/24 0612 10/01/24 0645  AST 30  --   --   --   --   --   ALT 20  --   --   --   --   --   ALKPHOS 72  --   --   --   --   --   BILITOT 0.3  --   --   --   --   --   PROT 7.4  --   --   --   --   --   ALBUMIN  3.7 3.2* 3.0* 3.1* 3.4* 3.7   No results for input(s): LIPASE, AMYLASE in the last 168 hours. No results for input(s): AMMONIA in the last 168 hours. Coagulation profile No results for input(s): INR, PROTIME in the last 168 hours. COVID-19 Labs  No results for input(s): DDIMER, FERRITIN, LDH, CRP in the last 72 hours.  No results found for: SARSCOV2NAA  CBC: Recent Labs  Lab 09/26/24 2046 09/27/24 0416 09/28/24 0532 09/28/24 1813  WBC 11.4* 9.4 7.8 8.2  NEUTROABS 6.6  --   --   --   HGB 10.5* 9.0* 7.6* 8.7*  HCT 32.0* 27.5* 22.4* 26.6*  MCV 84.9 83.8 83.6 86.4  PLT 436* 360 300 266   Cardiac Enzymes: No results for input(s): CKTOTAL, CKMB, CKMBINDEX, TROPONINI in the last 168 hours. BNP (last 3 results) No results for input(s): PROBNP in the last 8760 hours. CBG: Recent Labs  Lab 09/27/24 0924 09/28/24 0734 09/29/24 0754 09/30/24 0735  GLUCAP 103* 159* 100* 91   D-Dimer: No results for input(s): DDIMER in the last 72 hours. Hgb A1c: No results for input(s): HGBA1C in the last 72 hours. Lipid Profile: No results for input(s): CHOL, HDL, LDLCALC, TRIG, CHOLHDL, LDLDIRECT in the last 72 hours. Thyroid  function studies: No results for input(s): TSH, T4TOTAL, T3FREE, THYROIDAB in the last 72 hours.  Invalid input(s): FREET3 Anemia work up: No  results for input(s): VITAMINB12, FOLATE, FERRITIN, TIBC, IRON, RETICCTPCT in the last 72 hours. Sepsis Labs: Recent Labs  Lab 09/26/24 2046 09/27/24 0416 09/28/24 0532 09/28/24 1813 09/28/24 2144  WBC 11.4* 9.4 7.8 8.2  --   LATICACIDVEN  --   --   --   --  1.3   Microbiology Recent Results (from the past 240 hours)  Urine Culture     Status: Abnormal   Collection Time: 09/26/24 11:45 PM   Specimen: Urine, Clean Catch  Result Value  Ref Range Status   Specimen Description   Final    URINE, CLEAN CATCH Performed at [facility], [address], [place], [place] [address]    Special Requests   Final    NONE Performed at [facility], [address] [place], [place] [address]    Culture MULTIPLE SPECIES PRESENT, SUGGEST RECOLLECTION (A)  Final   Report Status 09/28/2024 FINAL  Final  Urine Culture     Status: None (Preliminary result)   Collection Time: 09/28/24  5:50 PM   Specimen: Urine, Random  Result Value Ref Range Status   Specimen Description   Final    URINE, RANDOM Performed at [facility], [address] [place], [place] [address]    Special Requests   Final    NONE Reflexed from [id number] Performed at [facility], [address], [place], [place] [address]    Culture   Final    CULTURE REINCUBATED FOR BETTER GROWTH Performed at [facility], [address], [place], [place] [address]    Report Status PENDING  Incomplete  MRSA Next Gen by PCR, Nasal     Status: None   Collection Time: 09/28/24  7:08 PM   Specimen: Nasal Mucosa; Nasal Swab  Result Value Ref Range Status   MRSA by PCR Next Gen NOT DETECTED NOT DETECTED Final    Comment: (NOTE) The GeneXpert MRSA Assay (FDA approved for NASAL specimens only), is one component of a comprehensive MRSA colonization surveillance program. It is not intended to diagnose MRSA infection nor to guide or monitor treatment for  MRSA infections. Test performance is not FDA approved in patients less than 17 years old. Performed at [facility], [address] [place], [place] [address]      Medications:    Chlorhexidine  Gluconate Cloth  6 each Topical Daily   doxycycline   100 mg Oral Q12H   heparin   5,000 Units Subcutaneous Q8H   midodrine   2.5 mg Oral TID WC   rosuvastatin   10 mg Oral QHS   Continuous Infusions:  cefTRIAXone  (ROCEPHIN )  IV 2 g (10/01/24 0014)      LOS: 4 days   Erle Odell Castor  [facility]  10/01/2024, 8:50 AM

## 2024-10-02 ENCOUNTER — Telehealth: Payer: PRIVATE HEALTH INSURANCE | Admitting: Nurse Practitioner

## 2024-10-02 DIAGNOSIS — N179 Acute kidney failure, unspecified: Secondary | ICD-10-CM | POA: Diagnosis not present

## 2024-10-02 LAB — GLUCOSE, CAPILLARY: Glucose-Capillary: 100 mg/dL — ABNORMAL HIGH (ref 70–99)

## 2024-10-02 MED ORDER — FLUCONAZOLE 100 MG PO TABS: 100.0000 mg | ORAL_TABLET | Freq: Every day | ORAL | 0 refills | Status: AC

## 2024-10-02 MED ORDER — FLUCONAZOLE 100 MG PO TABS: 100.0000 mg | ORAL_TABLET | Freq: Every day | ORAL | 0 refills | Status: DC

## 2024-10-02 MED ORDER — AMOXICILLIN-POT CLAVULANATE 500-125 MG PO TABS: 1.0000 | ORAL_TABLET | Freq: Two times a day (BID) | ORAL | 0 refills | Status: DC

## 2024-10-02 MED ORDER — PSYLLIUM 95 % PO PACK: 1.0000 | PACK | Freq: Every day | ORAL | 0 refills | Status: AC

## 2024-10-02 MED ORDER — PHENAZOPYRIDINE HCL 200 MG PO TABS: 200.0000 mg | ORAL_TABLET | Freq: Three times a day (TID) | ORAL | 0 refills | Status: AC | PRN

## 2024-10-02 MED ORDER — AMOXICILLIN-POT CLAVULANATE 500-125 MG PO TABS: 1.0000 | ORAL_TABLET | Freq: Two times a day (BID) | ORAL | 0 refills | Status: AC

## 2024-10-02 MED ORDER — FLUCONAZOLE 100 MG PO TABS
100.0000 mg | ORAL_TABLET | Freq: Every day | ORAL | Status: DC
  Filled 2024-10-02: qty 1

## 2024-10-02 NOTE — Plan of Care (Signed)
   Problem: Education: Goal: Knowledge of General Education information will improve Description: Including pain rating scale, medication(s)/side effects and non-pharmacologic comfort measures Outcome: Progressing   Problem: Pain Managment: Goal: General experience of comfort will improve and/or be controlled Outcome: Progressing   Problem: Safety: Goal: Ability to remain free from injury will improve Outcome: Progressing

## 2024-10-02 NOTE — Discharge Summary (Addendum)
 Physician Discharge Summary  Ashley Dean FMW:[id number] DOB: 11-17-1940 DOA: 09/26/2024  PCP: Ashley Pellet, MD  Admit date: 09/26/2024 Discharge date: 10/02/2024  Admitted From: Home Disposition:  Home  Recommendations for Outpatient Follow-up:  Follow up with PCP in 1-2 weeks Please obtain BMP/CBC in one week  Home Health:Yes Equipment/Devices:None  Discharge Condition:Stable CODE STATUS:FULL  Diet recommendation: Heart Healthy  Brief/Interim Summary: 83 y.o. female past medical history significant for essential hypertension, hyperlipidemia diabetes mellitus type 2, chronic kidney disease stage III AA presents with dysuria and ability to void.  She follows up with urology who did her ureteral cystoscopy did not find any intrinsic obstruction or hydronephrosis was attributed to her scoliosis.  In the ED was found to have creatinine of 5.6 (with a baseline creatinine around 1), UA were consistent with a urinary tract infection, CT scan of the abdomen pelvis showed bilateral ureteral thickening with stranding.  Also circumferential bladder wall thickening.  Urology was consulted   Discharge Diagnoses:  Principal Problem:   Acute renal failure (ARF) Active Problems:   Essential hypertension   Hyperlipidemia   Type 2 diabetes mellitus (HCC)   CKD stage 3a, GFR 45-59 ml/min (HCC)  Acute kidney injury on chronic kidney disease stage IIIa: No significant structure seen on CT likely due to infectious etiology. Baseline creatinine around 1. Urology was consulted deemed her not a candidate for surgical and prevention in the setting of chronic UPJ obstruction. ACE inhibitor and diuretic therapies were held due to be resumed by his PCP as an outpatient. Her creatinine improved with treatment of urinary tract infection.  Sepsis due to acute pyelonephritis present on admission: With purulent urine urine culture was inconclusive she was started empirically on IV antibiotics. Was/status  started on midodrine . Her blood pressure stabilized. Repeated urine culture grew yeast Diflucan  was added. She will continue Augmentin  and Diflucan  as an outpatient she was weaned off midodrine .  Hypokalemia: Repleted now resolved.  Normocytic anemia, Transfused 1 unit packed red blood cells hemoglobin remained stable to 10.  Hyperphosphatemia: Improved with IV fluids.  Essential hypertension: Initially antihypertensive medications were held as her blood pressure was borderline. She will continue to hold ACE inhibitor and diuretic therapy show continue atenolol  as an outpatient.  Hyperlipidemia: Sign continue statins.  Diabetes mellitus type 2: No change made to her medication.  Discharge Instructions  Discharge Instructions     Diet - low sodium heart healthy   Complete by: As directed    Increase activity slowly   Complete by: As directed       Allergies as of 10/02/2024       Reactions   Nsaids Other (See Comments)   Renal insufficiency   Macrodantin [nitrofurantoin Macrocrystal] Rash        Medication List     PAUSE taking these medications    hydrochlorothiazide 25 MG tablet Wait to take this until your doctor or other care provider tells you to start again. Commonly known as: HYDRODIURIL Take 25 mg by mouth daily.       STOP taking these medications    estradiol 0.01 % Crea vaginal cream Commonly known as: ESTRACE   lisinopril 10 MG tablet Commonly known as: ZESTRIL   Rybelsus 7 MG Tabs Generic drug: Semaglutide   Uribel 81.6 MG Tabs Generic drug: Meth-Hyo-M Bl-Benz Acd-Ph Sal       TAKE these medications    amoxicillin -clavulanate 500-125 MG tablet Commonly known as: AUGMENTIN  Take 1 tablet by mouth 2 (two) times daily  for 7 days.   atenolol  50 MG tablet Commonly known as: TENORMIN  Take 50 mg by mouth every morning.   beta carotene w/minerals tablet Take 1 tablet by mouth daily. Capsule form   multivitamin with minerals  tablet Take 2 tablets by mouth daily.   CALCIUM  PO Take 1 tablet by mouth daily.   DULoxetine 30 MG capsule Commonly known as: CYMBALTA Take 90 mg by mouth daily.   fluconazole  100 MG tablet Commonly known as: DIFLUCAN  Take 1 tablet (100 mg total) by mouth daily for 7 days.   oxyCODONE -acetaminophen  10-325 MG tablet Commonly known as: PERCOCET Take 1 tablet by mouth 3 (three) times daily as needed for pain.   psyllium 95 % Pack Commonly known as: HYDROCIL/METAMUCIL Take 1 packet by mouth daily for 10 days. Start taking on: October 03, 2024   rosuvastatin  10 MG tablet Commonly known as: CRESTOR  Take 10 mg by mouth at bedtime.   VITAMIN C PO Take 1 tablet by mouth daily.   VITAMIN D PO Take 1 capsule by mouth daily.        Contact information for follow-up providers     [facility] Follow up in 1 week(s).   Specialty: Urology Why: Call for an appointment Contact information: [address] [place], [place] [address] [facility]  [address] [phone]             Contact information for after-discharge care     Home Medical Care     [facility] .   Service: Home Health Services Why: HHPT Contact information: [address] [place]  [address] [phone]                    Allergies  Allergen Reactions   Nsaids Other (See Comments)    Renal insufficiency   Macrodantin [Nitrofurantoin Macrocrystal] Rash    Consultations: Urology   Procedures/Studies: CT ABDOMEN PELVIS WO CONTRAST Result Date: 09/26/2024 EXAM: CT ABDOMEN AND PELVIS WITHOUT CONTRAST 09/26/2024 09:42:35 PM TECHNIQUE: CT of the abdomen and pelvis was performed without the administration of intravenous contrast. Multiplanar reformatted images are provided for review. Automated exposure control, iterative reconstruction, and/or weight-based adjustment of the mA/kV was utilized to reduce  the radiation dose to as low as reasonably achievable. COMPARISON: 07/08/2024 CLINICAL HISTORY: Pain with urination, abdominal pain, urinary retention. FINDINGS: LOWER CHEST: No acute abnormality. LIVER: The liver is unremarkable. GALLBLADDER AND BILE DUCTS: Status post cholecystectomy. No biliary ductal dilatation. SPLEEN: No acute abnormality. PANCREAS: No acute abnormality. ADRENAL GLANDS: No acute abnormality. KIDNEYS, URETERS AND BLADDER: Stable at 14 mm hyperdense exophytic lesion arises from the interval region of the right kidney laterally since remote prior examination of 06/04/2021 which is in keeping with a hyperdense cyst. No follow up imaging is recommended. Multiple nonobstructive 1 - 2 mm punctate renal calculi are again identified bilaterally, unchanged from prior examination. There is interval development of bilateral urothelial thickening and periureteric inflammatory stranding as well as development of mild hydronephrosis and progressive distention of the dilated right renal pelvis which may reflect changes of bilateral ascending urinary tract infection. There is progressive circumferential bladder wall thickening and subtle periarticular inflammatory stranding suggesting an underlying infectious or inflammatory cystitis. Correlation with urinalysis and urine culture may be helpful for further management. No ureteral calculi. No perinephric inflammatory stranding or fluid collection identified. GI AND BOWEL: Appendix normal. The stomach, small bowel, and large bowel are otherwise unremarkable.  PERITONEUM AND RETROPERITONEUM: No ascites. No free air. VASCULATURE: Aorta is normal in caliber. Moderate aortoiliac atherosclerotic calcification. No aortic aneurysm. LYMPH NODES: No lymphadenopathy. REPRODUCTIVE ORGANS: Uterus absent. No adnexal masses. BONES AND SOFT TISSUES: Osseous structures are age appropriate. No acute bone abnormality. No lytic or blastic bone lesion. Small broad-based  fat-containing umbilical hernia. No focal soft tissue abnormality. IMPRESSION: 1. Interval development of bilateral urothelial thickening with periureteric inflammatory stranding, mild hydronephrosis, and progressive distention of the dilated right renal pelvis, suggestive of bilateral ascending urinary tract infection; no ureteral calculi . no perinephric inflammatory stranding or fluid collection identified 2. Progressive circumferential bladder wall thickening with subtle perivesical inflammatory stranding, suggestive of infectious or inflammatory cystitis; correlation with urinalysis and urine culture may be helpful for further management 3. Stable 14 mm hyperdense exophytic right renal cyst, compatible with benign hyperdense cyst; no follow-up imaging recommended 4. Multiple bilateral nonobstructive punctate renal calculi, unchanged 5. raf score: Aortic atherosclerosis (icd10-i70.0) Electronically signed by: Dorethia Molt MD 09/26/2024 10:32 PM EST RP Workstation: [id number]   (Echo, Carotid, EGD, Colonoscopy, ERCP)    Subjective: No complaints  Discharge Exam: Vitals:   10/02/24 0513 10/02/24 0843  BP: 130/71 (!) 144/80  Pulse: 85 (!) 102  Resp: 15   Temp: 98.3 F (36.8 C)   SpO2: 96% 98%   Vitals:   10/01/24 1936 10/02/24 0500 10/02/24 0513 10/02/24 0843  BP: (!) 140/82  130/71 (!) 144/80  Pulse: 98  85 (!) 102  Resp: 15  15   Temp: 98.5 F (36.9 C)  98.3 F (36.8 C)   TempSrc:      SpO2: 100%  96% 98%  Weight:  59.7 kg    Height:        General: Pt is alert, awake, not in acute distress Cardiovascular: RRR, S1/S2 +, no rubs, no gallops Respiratory: CTA bilaterally, no wheezing, no rhonchi Abdominal: Soft, NT, ND, bowel sounds + Extremities: no edema, no cyanosis    The results of significant diagnostics from this hospitalization (including imaging, microbiology, ancillary and laboratory) are listed below for reference.     Microbiology: Recent Results (from the  past 240 hours)  Urine Culture     Status: Abnormal   Collection Time: 09/26/24 11:45 PM   Specimen: Urine, Clean Catch  Result Value Ref Range Status   Specimen Description   Final    URINE, CLEAN CATCH Performed at [facility], [address] [place], [place] [address]    Special Requests   Final    NONE Performed at [facility], [address], [place], [place] [address]    Culture MULTIPLE SPECIES PRESENT, SUGGEST RECOLLECTION (A)  Final   Report Status 09/28/2024 FINAL  Final  Urine Culture     Status: Abnormal   Collection Time: 09/28/24  5:50 PM   Specimen: Urine, Random  Result Value Ref Range Status   Specimen Description   Final    URINE, RANDOM Performed at [facility], [address], [place], [place] [address]    Special Requests   Final    NONE Reflexed from [id number] Performed at [facility], [address], [place], [place] [address]    Culture >=100,000 COLONIES/mL YEAST (A)  Final   Report Status 10/01/2024 FINAL  Final  MRSA Next Gen by PCR, Nasal     Status: None   Collection Time: 09/28/24  7:08 PM   Specimen: Nasal Mucosa; Nasal Swab  Result Value  Ref Range Status   MRSA by PCR Next Gen NOT DETECTED NOT DETECTED Final    Comment: (NOTE) The GeneXpert MRSA Assay (FDA approved for NASAL specimens only), is one component of a comprehensive MRSA colonization surveillance program. It is not intended to diagnose MRSA infection nor to guide or monitor treatment for MRSA infections. Test performance is not FDA approved in patients less than 55 years old. Performed at [facility], [address] [place], [place] [address]      Labs: BNP (last 3 results) No results for input(s): BNP in the last 8760 hours. Basic Metabolic Panel: Recent Labs  Lab 09/27/24 0416 09/28/24 0532 09/29/24 0320 09/30/24 0612 10/01/24 0645  NA 133* 136 143 141 140  K 5.1 4.2  4.5 4.6 4.7  CL 103 102 110 108 107  CO2 13* 21* 22 22 21*  GLUCOSE 103* 188* 105* 92 98  BUN 113* 102* 83* 73* 62*  CREATININE 5.07* 3.90* 2.74* 2.40* 2.38*  CALCIUM  9.6 8.6* 8.0* 8.7* 8.8*  MG 1.9  --   --   --   --   PHOS 7.7*  7.7* 4.7* 3.1 2.8 2.3*   Liver Function Tests: Recent Labs  Lab 09/26/24 2046 09/27/24 0416 09/28/24 0532 09/29/24 0320 09/30/24 0612 10/01/24 0645  AST 30  --   --   --   --   --   ALT 20  --   --   --   --   --   ALKPHOS 72  --   --   --   --   --   BILITOT 0.3  --   --   --   --   --   PROT 7.4  --   --   --   --   --   ALBUMIN  3.7 3.2* 3.0* 3.1* 3.4* 3.7   No results for input(s): LIPASE, AMYLASE in the last 168 hours. No results for input(s): AMMONIA in the last 168 hours. CBC: Recent Labs  Lab 09/26/24 2046 09/27/24 0416 09/28/24 0532 09/28/24 1813  WBC 11.4* 9.4 7.8 8.2  NEUTROABS 6.6  --   --   --   HGB 10.5* 9.0* 7.6* 8.7*  HCT 32.0* 27.5* 22.4* 26.6*  MCV 84.9 83.8 83.6 86.4  PLT 436* 360 300 266   Cardiac Enzymes: No results for input(s): CKTOTAL, CKMB, CKMBINDEX, TROPONINI in the last 168 hours. BNP: Invalid input(s): POCBNP CBG: Recent Labs  Lab 09/28/24 0734 09/29/24 0754 09/30/24 0735 10/01/24 1032 10/02/24 0709  GLUCAP 159* 100* 91 127* 100*   D-Dimer No results for input(s): DDIMER in the last 72 hours. Hgb A1c No results for input(s): HGBA1C in the last 72 hours. Lipid Profile No results for input(s): CHOL, HDL, LDLCALC, TRIG, CHOLHDL, LDLDIRECT in the last 72 hours. Thyroid  function studies No results for input(s): TSH, T4TOTAL, T3FREE, THYROIDAB in the last 72 hours.  Invalid input(s): FREET3 Anemia work up No results for input(s): VITAMINB12, FOLATE, FERRITIN, TIBC, IRON, RETICCTPCT in the last 72 hours. Urinalysis    Component Value Date/Time   COLORURINE YELLOW 09/28/2024 1750   APPEARANCEUR TURBID (A) 09/28/2024 1750   LABSPEC 1.009  09/28/2024 1750   PHURINE 6.0 09/28/2024 1750   GLUCOSEU NEGATIVE 09/28/2024 1750   HGBUR LARGE (A) 09/28/2024 1750   BILIRUBINUR NEGATIVE 09/28/2024 1750   KETONESUR NEGATIVE 09/28/2024 1750   PROTEINUR 100 (A) 09/28/2024 1750   NITRITE NEGATIVE 09/28/2024 1750   LEUKOCYTESUR LARGE (A) 09/28/2024 1750  Sepsis Labs Recent Labs  Lab 09/26/24 2046 09/27/24 0416 09/28/24 0532 09/28/24 1813  WBC 11.4* 9.4 7.8 8.2   Microbiology Recent Results (from the past 240 hours)  Urine Culture     Status: Abnormal   Collection Time: 09/26/24 11:45 PM   Specimen: Urine, Clean Catch  Result Value Ref Range Status   Specimen Description   Final    URINE, CLEAN CATCH Performed at [facility], [address], [place], [place] [address]    Special Requests   Final    NONE Performed at [facility], [address] [place], [place] [address]    Culture MULTIPLE SPECIES PRESENT, SUGGEST RECOLLECTION (A)  Final   Report Status 09/28/2024 FINAL  Final  Urine Culture     Status: Abnormal   Collection Time: 09/28/24  5:50 PM   Specimen: Urine, Random  Result Value Ref Range Status   Specimen Description   Final    URINE, RANDOM Performed at [facility], [address], [place], [place] [address]    Special Requests   Final    NONE Reflexed from [id number] Performed at [facility], [address], [place], [place] [address]    Culture >=100,000 COLONIES/mL YEAST (A)  Final   Report Status 10/01/2024 FINAL  Final  MRSA Next Gen by PCR, Nasal     Status: None   Collection Time: 09/28/24  7:08 PM   Specimen: Nasal Mucosa; Nasal Swab  Result Value Ref Range Status   MRSA by PCR Next Gen NOT DETECTED NOT DETECTED Final    Comment: (NOTE) The GeneXpert MRSA Assay (FDA approved for NASAL specimens only), is one component of a comprehensive MRSA colonization surveillance program. It is not intended to diagnose MRSA  infection nor to guide or monitor treatment for MRSA infections. Test performance is not FDA approved in patients less than 81 years old. Performed at [facility] [address] [place], [place] [address]      Time coordinating discharge: Over 35 minutes  SIGNED:   Erle Odell Castor, MD  [facility] 10/02/2024, 9:22 AM Pager   If 7PM-7AM, please contact night-coverage [email or web address] Password [email or web address]

## 2024-10-11 NOTE — Progress Notes (Unsigned)
 Chief Complaint:  Urinary tract infections, blocked kidney  History of Present Illness:  83 year old female presents to our office for the first time for evaluation and management of recurrent urinary tract infections as well as right hydronephrosis.  She has a history of kidney stones.  She underwent ureteroscopic stone management of a left distal ureteral stone in July 2018.  She had a left renal stone treated with shockwave lithotripsy in December 2016.  In July, 2022 she underwent CT chest/abdomen/pelvis which showed no renal calculi, but mild dilatation of the right renal pelvis and renal scarring bilaterally.  Workup in 2023 was performed because of worsening right hydronephrosis.  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  There was a small left renal stone.  There was also a previously seen 1.2 cm simple cyst, exophytic, on the right lower pole.  She had Lasix  renogram which showed findings consistent with obstruction.  6.7.2023: Underwent cystoscopy, right retrograde, right ureteroscopy and ureteral stent by Dr. Devere.  This revealed right UPJ narrowing with mild to moderate dilatation of the right renal pelvis and calyces.  No upper tract lesions seen.  No intravesical abnormality seen.  Stent with tether placed.  Serum creatinine 0.6, GFR 8780  2.8.2024: Routine follow-up.  At that time, creatinine 1.0, GFR 53.  There was severe right hydro on ultrasound.  Urinalysis was clear.  9.18.2024: Hydronephrosis confirmed on repeat renal ultrasound.  Creatinine 0.7, GFR 81  4.17.2025: Routine follow-up.  Stable hydro noted on ultrasound.  Postvoid residual 23 mL.  Serum creatinine 1.2, GFR 42  5.7.2025: Repeat cystoscopy, retrograde, right ureteroscopy.  No stenosis of right UPJ noted.  No urothelial abnormalities noted. Past Medical History:  Past Medical History:  Diagnosis Date   Arthritis    back   Depression    Diverticulosis of colon    Fibromyalgia     Headache    History of adenomatous polyp of colon    09-20-2004   History of kidney stones    History of pelvic mass    12-04-2015  s/p  lap. 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   History of urinary retention    hx post-op retention x 1 yrs ago none since   Hyperlipidemia    Hypertension    Left ureteral stone    Macular degeneration    Osteopenia    Type 2 diabetes mellitus (HCC)    Wears dentures    upper   Wears glasses     Past Surgical History:  Past Surgical History:  Procedure Laterality Date   ANKLE SURGERY Bilateral    yrs ago   CATARACT EXTRACTION W/ INTRAOCULAR LENS  IMPLANT, BILATERAL  1992   CYSTOSCOPY W/ URETERAL STENT PLACEMENT Right 04/16/2022   Procedure: CYSTOSCOPY WITH RIGHT RETROGRADE PYELOGRAM/ right ureteroscopy/  URETERAL STENT PLACEMENT;  Surgeon: Devere Lonni Righter, MD;  Location: [facility];  Service: Urology;  Laterality: Right;   CYSTOSCOPY WITH URETEROSCOPY AND STENT PLACEMENT Left 05/19/2017   Procedure: CYSTOSCOPY WITH URETEROSCOPY/ STONE EXTRACTION;  Surgeon: Watt Rush, MD;  Location[facility];  Service: Urology;  Laterality: Left;   CYSTOSCOPY/URETEROSCOPY/HOLMIUM LASER/STENT PLACEMENT Right 03/16/2024   Procedure: CYSTOSCOPY/URETEROSCOPY/STENT PLACEMENT/RETROGRADE;  Surgeon: Devere Lonni Righter, MD;  Location: [facility];  Service: Urology;  Laterality: Right;  CYSTOSCOPY/RIGHT RETROGRADE PYELOGRAM/URETEROSCOPY/POSSIBLE HOLMIUM LASER/POSSIBLE STENT PLACEMENT   EXTRACORPOREAL SHOCK WAVE LITHOTRIPSY Left last one 11/08/2015   previously x2   LAPAROSCOPIC CHOLECYSTECTOMY  04-09-2005   dr toth   LAPAROSCOPY W/ BILATERAL SALPINGOOPHORECTOMY   12-04-2015   at [facility])   NEPHROLITHOTOMY  1978   ORIF RIGHT ANKLE FX  11/09/2008   TONSILLECTOMY  child   VAGINAL HYSTERECTOMY  1978    Allergies:  Allergies  Allergen Reactions   Nsaids Other (See Comments)     Renal insufficiency   Macrodantin [Nitrofurantoin Macrocrystal] Rash    Family History:  No family history on file.  Social History:  Social History   Tobacco Use   Smoking status: Former    Current packs/day: 0.00    Average packs/day: 1.5 packs/day for 20.0 years (30.0 ttl pk-yrs)    Types: Cigarettes    Start date: 11/10/1982    Quit date: 11/10/2002    Years since quitting: 21.9    Passive exposure: Never   Smokeless tobacco: Never  Vaping Use   Vaping status: Never Used  Substance Use Topics   Alcohol use: No   Drug use: No    Comment: no    Review of symptoms:  Constitutional:  Negative for unexplained weight loss, night sweats, fever, chills ENT:  Negative for nose bleeds, sinus pain, painful swallowing CV:  Negative for chest pain, shortness of breath, exercise intolerance, palpitations, loss of consciousness Resp:  Negative for cough, wheezing, shortness of breath GI:  Negative for nausea, vomiting, diarrhea, bloody stools GU:  Positives noted in HPI; otherwise negative for gross hematuria, dysuria, urinary incontinence Neuro:  Negative for seizures, poor balance, limb weakness, slurred speech Psych:  Negative for lack of energy, depression, anxiety Endocrine:  Negative for polydipsia, polyuria, symptoms of hypoglycemia (dizziness, hunger, sweating) Hematologic:  Negative for anemia, purpura, petechia, prolonged or excessive bleeding, use of anticoagulants  Allergic:  Negative for difficulty breathing or choking as a result of exposure to anything; no shellfish allergy; no allergic response (rash/itch) to materials, foods  Physical exam: There were no vitals taken for this visit. GENERAL APPEARANCE:  Well appearing, well developed, well nourished, NAD HEENT: Atraumatic, Normocephalic. NECK: Normal appearance LUNGS: Normal inspiratory and expiratory excursion HEART: Regular Rate ABDOMEN: *** EXTREMITIES: Moves all extremities well.  Without clubbing, cyanosis, or  edema. NEUROLOGIC:  Alert and oriented x 3, normal gait, CN II-XII grossly intact.  MENTAL STATUS:  Appropriate. SKIN:  Warm, dry and intact.    Results: No results found for this or any previous visit (from the past 24 hours).  I have reviewed prior patient's records  I have reviewed referring/prior physicians records  I have reviewed urinalysis  I have reviewed prior urine cultures  I reviewed prior imaging studies--CT from 11.16.2025- 1. Interval development of bilateral urothelial thickening with periureteric inflammatory stranding, mild hydronephrosis, and progressive distention of the dilated right renal pelvis, suggestive of bilateral ascending urinary tract infection; no ureteral calculi . no perinephric inflammatory stranding or fluid collection identified 2. Progressive circumferential bladder wall thickening with subtle perivesical inflammatory stranding, suggestive of infectious or inflammatory cystitis; correlation with urinalysis and urine culture may be helpful for further management 3. Stable 14 mm hyperdense exophytic right renal cyst, compatible with benign hyperdense cyst; no follow-up imaging recommended 4. Multiple bilateral nonobstructive punctate renal calculi, unchanged 5. raf score: Aortic atherosclerosis (icd10-i70.0)    Assessment: No diagnosis found.   Plan: ***

## 2024-10-12 ENCOUNTER — Ambulatory Visit: Payer: PRIVATE HEALTH INSURANCE | Admitting: Urology

## 2024-10-12 VITALS — BP 137/67 | HR 72 | Ht 65.0 in | Wt 125.0 lb

## 2024-10-12 DIAGNOSIS — N3289 Other specified disorders of bladder: Secondary | ICD-10-CM

## 2024-10-12 DIAGNOSIS — N398 Other specified disorders of urinary system: Secondary | ICD-10-CM

## 2024-10-12 DIAGNOSIS — N952 Postmenopausal atrophic vaginitis: Secondary | ICD-10-CM

## 2024-10-12 DIAGNOSIS — Z87442 Personal history of urinary calculi: Secondary | ICD-10-CM

## 2024-10-12 DIAGNOSIS — Z87440 Personal history of urinary (tract) infections: Secondary | ICD-10-CM

## 2024-10-12 DIAGNOSIS — N1330 Unspecified hydronephrosis: Secondary | ICD-10-CM

## 2024-10-12 DIAGNOSIS — R3915 Urgency of urination: Secondary | ICD-10-CM

## 2024-10-12 DIAGNOSIS — N289 Disorder of kidney and ureter, unspecified: Secondary | ICD-10-CM

## 2024-10-12 DIAGNOSIS — N390 Urinary tract infection, site not specified: Secondary | ICD-10-CM | POA: Diagnosis not present

## 2024-10-12 DIAGNOSIS — R350 Frequency of micturition: Secondary | ICD-10-CM

## 2024-10-12 DIAGNOSIS — R300 Dysuria: Secondary | ICD-10-CM

## 2024-10-12 LAB — URINALYSIS, ROUTINE W REFLEX MICROSCOPIC
Bilirubin, UA: NEGATIVE
Glucose, UA: NEGATIVE
Ketones, UA: NEGATIVE
Nitrite, UA: POSITIVE — AB
Specific Gravity, UA: 1.02 (ref 1.005–1.030)
Urobilinogen, Ur: 0.2 mg/dL (ref 0.2–1.0)
pH, UA: 6.5 (ref 5.0–7.5)

## 2024-10-12 LAB — MICROSCOPIC EXAMINATION
RBC, Urine: >30 /HPF — AB (ref 0–2)
WBC, UA: >30 /HPF — AB (ref 0–5)

## 2024-10-12 LAB — BLADDER SCAN AMB NON-IMAGING: Scan Result: 6

## 2024-10-12 MED ORDER — METHENAMINE HIPPURATE 1 G PO TABS: 1.0000 g | ORAL_TABLET | Freq: Two times a day (BID) | ORAL | 11 refills | Status: DC

## 2024-10-12 MED ORDER — ESTRADIOL 0.01 % VA CREA: TOPICAL_CREAM | VAGINAL | 3 refills | Status: DC

## 2024-10-13 DIAGNOSIS — R600 Localized edema: Secondary | ICD-10-CM | POA: Diagnosis not present

## 2024-10-13 LAB — COMPREHENSIVE METABOLIC PANEL WITH GFR
ALT: 35 IU/L — ABNORMAL HIGH (ref 0–32)
AST: 47 IU/L — ABNORMAL HIGH (ref 0–40)
Albumin: 3.8 g/dL (ref 3.7–4.7)
Alkaline Phosphatase: 65 IU/L (ref 48–129)
BUN/Creatinine Ratio: 16 (ref 12–28)
BUN: 42 mg/dL — ABNORMAL HIGH (ref 8–27)
Bilirubin Total: 0.2 mg/dL (ref 0.0–1.2)
CO2: 17 mmol/L — ABNORMAL LOW (ref 20–29)
Calcium: 9.1 mg/dL (ref 8.7–10.3)
Chloride: 109 mmol/L — ABNORMAL HIGH (ref 96–106)
Creatinine, Ser: 2.57 mg/dL — ABNORMAL HIGH (ref 0.57–1.00)
Globulin, Total: 2.8 g/dL (ref 1.5–4.5)
Glucose: 97 mg/dL (ref 70–99)
Potassium: 5.5 mmol/L — ABNORMAL HIGH (ref 3.5–5.2)
Sodium: 140 mmol/L (ref 134–144)
Total Protein: 6.6 g/dL (ref 6.0–8.5)
eGFR: 18 mL/min/1.73 — ABNORMAL LOW (ref >59)

## 2024-10-15 LAB — URINE CULTURE: Organism ID, Bacteria: NO GROWTH

## 2024-10-16 ENCOUNTER — Ambulatory Visit: Payer: Self-pay | Admitting: Urology

## 2024-10-17 ENCOUNTER — Ambulatory Visit: Payer: Self-pay | Admitting: Urology

## 2024-10-17 DIAGNOSIS — R3915 Urgency of urination: Secondary | ICD-10-CM

## 2024-10-17 DIAGNOSIS — N1330 Unspecified hydronephrosis: Secondary | ICD-10-CM

## 2024-10-17 DIAGNOSIS — N390 Urinary tract infection, site not specified: Secondary | ICD-10-CM

## 2024-10-17 MED ORDER — ESTRADIOL 0.01 % VA CREA: TOPICAL_CREAM | VAGINAL | 3 refills | Status: DC

## 2024-10-17 MED ORDER — SOLIFENACIN SUCCINATE 10 MG PO TABS: 10.0000 mg | ORAL_TABLET | Freq: Every day | ORAL | 11 refills | Status: DC

## 2024-10-17 MED ORDER — FLUCONAZOLE 100 MG PO TABS: 100.0000 mg | ORAL_TABLET | Freq: Every day | ORAL | 0 refills | Status: DC

## 2024-10-17 NOTE — Addendum Note (Signed)
 Addended by: MATILDA SENIOR on: 10/17/2024 01:57 PM   Modules accepted: Orders

## 2024-10-18 ENCOUNTER — Other Ambulatory Visit: Payer: Self-pay

## 2024-10-18 ENCOUNTER — Telehealth: Payer: Self-pay | Admitting: Urology

## 2024-10-18 DIAGNOSIS — N390 Urinary tract infection, site not specified: Secondary | ICD-10-CM

## 2024-10-18 DIAGNOSIS — R600 Localized edema: Secondary | ICD-10-CM | POA: Diagnosis not present

## 2024-10-18 DIAGNOSIS — R7989 Other specified abnormal findings of blood chemistry: Secondary | ICD-10-CM | POA: Diagnosis not present

## 2024-10-18 DIAGNOSIS — R3915 Urgency of urination: Secondary | ICD-10-CM

## 2024-10-18 MED ORDER — FLUCONAZOLE 100 MG PO TABS: 100.0000 mg | ORAL_TABLET | Freq: Every day | ORAL | 0 refills | Status: DC

## 2024-10-18 MED ORDER — SOLIFENACIN SUCCINATE 10 MG PO TABS: 10.0000 mg | ORAL_TABLET | Freq: Every day | ORAL | 11 refills | Status: AC

## 2024-10-18 NOTE — Telephone Encounter (Signed)
 Patient would like for someone to call her in ref to her results

## 2024-10-18 NOTE — Telephone Encounter (Signed)
 Returned pt's call. 2 of pt's Rx's were sent to incorrect pharmacy, resent those Rx's to correct pharmacy. Pt was also wondering about her culture. Per the result note on the culture, Dr D states culture is negative. Pt expressed understanding.

## 2024-10-20 NOTE — Progress Notes (Addendum)
 Anesthesia Review:  PCP: Alberta Sharps  Cardiologist :  PPM/ ICD: Device Orders: Rep Notified:  Chest x-ray : EKG : 09/28/24  Echo : Stress test: Cardiac Cath :   Activity level: can do a flight of stairs without difficutly  Sleep Study/ CPAP : none  Fasting Blood Sugar :      / Checks Blood Sugar -- times a day:     DM- type2- currently on no meds  Hgba1c-  10/24/24- 4.7  Blood Thinner/ Instructions Cherre Dose: ASA / Instructions/ Last Dose :    10/12/24- CMP and U/A  09/28/24-CBC    10/24/24- cbc- routed to DR Dahlstedt.  White count- 12.0 BMp  done 10/24/24 with potassium of 5.8 routed to DR Dahlstedt on 10/24/2024. Burnard Senna ,[facility] aware on 10/24/24.   Potassium 5.8.   Spoke with Burnard Senna Barre and told her I would call pt at home  per her request and go over each med she is taking with pill bottles in front of her. Prep nurse reviewed meds pt is taking at preop and what t times but would call her again when she is at home   Called pt and completed. Pt ws informed that Potassium is elevated.    Pt denies taking any Lisinopril.  PT aware to drink 40 ounces of water  ( like 2 20 ounce  Soda bottles ) of water  today per order of Burnard Senna BARRE   PT voiced understanding.  PT did not mention to nurse she was taking any hydrochlorothiazide when reviewed meds .  Burnard Senna BARRE stated pt is supposed to be taking hydrochlorothiazide 12.5 mg.  Called Burnard Senna ,[facility] and made her aware of all meds pt was taking per pt with pill bottles in front of her at home.  Burnard Senna BARRE asked me to call pt back and to verify whether or not she is taking hydrochlorothiazide 12.5 mg daily and if she is not pt needs to call PCP DR Sharps and obtain a script.  Called pt back for second time and PT verified she is taking hydrochlorothiazide 12.5mg  daily in am.  PT states she is cutting a 25 mg pill in half and taking one daily.  PT also verified she is taking a probiotic ( which she did not state on the  first phone call ) and vesicare  10mg  daily ( which she did not state on first phone call).   PT again stated she is not taking any lisinopril.  Burnard Senna Barre called and made aware.  Willl make sure med list is correct in epic.  Preop nurse went thru each med pt told me she was on looking at pill bottles at home and reconciled med list in epic on 10/24/24.

## 2024-10-20 NOTE — Patient Instructions (Addendum)
 SURGICAL WAITING ROOM VISITATION  Patients having surgery or a procedure may have no more than 2 support people in the waiting area - these visitors may rotate.    Children ages 60 and under will not be able to visit patients in [facility] under most circumstances.   Visitors with respiratory illnesses are discouraged from visiting and should remain at home.  If the patient needs to stay at the hospital during part of their recovery, the visitor guidelines for inpatient rooms apply. Pre-op nurse will coordinate an appropriate time for 1 support person to accompany patient in pre-op.  This support person may not rotate.    Please refer to the [facility] website for the visitor guidelines for Inpatients (after your surgery is over and you are in a regular room).       Your procedure is scheduled on:  10/26/2024    Report to [facility] Main Entrance    Report to admitting at  0630 AM   Call this number if you have problems the morning of surgery [phone]   Do not eat food  or drink liquids :After Midnight.                If you have questions, please contact your surgeons office.      Oral Hygiene is also important to reduce your risk of infection.                                    Remember - BRUSH YOUR TEETH THE MORNING OF SURGERY WITH YOUR REGULAR TOOTHPASTE  DENTURES WILL BE REMOVED PRIOR TO SURGERY PLEASE DO NOT APPLY Poly grip OR ADHESIVES!!!   Do NOT smoke after Midnight   Stop all vitamins and herbal supplements 7 days before surgery.   Take these medicines the morning of surgery with A SIP OF WATER:  atenolol , vesicare    DO NOT TAKE ANY ORAL DIABETIC MEDICATIONS DAY OF YOUR SURGERY  Bring CPAP mask and tubing day of surgery.                              You may not have any metal on your body including hair pins, jewelry, and body piercing             Do not wear make-up, lotions, powders, perfumes/cologne, or deodorant  Do not  wear nail polish including gel and S&S, artificial/acrylic nails, or any other type of covering on natural nails including finger and toenails. If you have artificial nails, gel coating, etc. that needs to be removed by a nail salon please have this removed prior to surgery or surgery may need to be canceled/ delayed if the surgeon/ anesthesia feels like they are unable to be safely monitored.   Do not shave  48 hours prior to surgery.               Men may shave face and neck.   Do not bring valuables to the hospital. [facility] IS NOT             RESPONSIBLE   FOR VALUABLES.   Contacts, glasses, dentures or bridgework may not be worn into surgery.   Bring small overnight bag day of surgery.   DO NOT BRING YOUR HOME MEDICATIONS TO THE HOSPITAL. PHARMACY WILL DISPENSE MEDICATIONS LISTED ON YOUR MEDICATION LIST TO YOU  DURING YOUR ADMISSION IN THE HOSPITAL!    Patients discharged on the day of surgery will not be allowed to drive home.  Someone NEEDS to stay with you for the first 24 hours after anesthesia.   Special Instructions: Bring a copy of your healthcare power of attorney and living will documents the day of surgery if you haven't scanned them before.              Please read over the following fact sheets you were given: IF YOU HAVE QUESTIONS ABOUT YOUR PRE-OP INSTRUCTIONS PLEASE CALL [phone].   If you received a COVID test during your pre-op visit  it is requested that you wear a mask when out in public, stay away from anyone that may not be feeling well and notify your surgeon if you develop symptoms. If you test positive for Covid or have been in contact with anyone that has tested positive in the last 10 days please notify you surgeon.    [facility] - Preparing for Surgery Before surgery, you can play an important role.  Because skin is not sterile, your skin needs to be as free of germs as possible.  You can reduce the number of germs on your skin by washing with CHG  (chlorahexidine gluconate) soap before surgery.  CHG is an antiseptic cleaner which kills germs and bonds with the skin to continue killing germs even after washing. Please DO NOT use if you have an allergy to CHG or antibacterial soaps.  If your skin becomes reddened/irritated stop using the CHG and inform your nurse when you arrive at Short Stay. Do not shave (including legs and underarms) for at least 48 hours prior to the first CHG shower.  You may shave your face/neck. Please follow these instructions carefully:  1.  Shower with CHG Soap the night before surgery and the  morning of Surgery.  2.  If you choose to wash your hair, wash your hair first as usual with your  normal  shampoo.  3.  After you shampoo, rinse your hair and body thoroughly to remove the  shampoo.                           4.  Use CHG as you would any other liquid soap.  You can apply chg directly  to the skin and wash                       Gently with a scrungie or clean washcloth.  5.  Apply the CHG Soap to your body ONLY FROM THE NECK DOWN.   Do not use on face/ open                           Wound or open sores. Avoid contact with eyes, ears mouth and genitals (private parts).                       Wash face,  Genitals (private parts) with your normal soap.             6.  Wash thoroughly, paying special attention to the area where your surgery  will be performed.  7.  Thoroughly rinse your body with warm water from the neck down.  8.  DO NOT shower/wash with your normal soap after using and rinsing off  the CHG Soap.  9.  Pat yourself dry with a clean towel.            10.  Wear clean pajamas.            11.  Place clean sheets on your bed the night of your first shower and do not  sleep with pets. Day of Surgery : Do not apply any lotions/deodorants the morning of surgery.  Please wear clean clothes to the hospital/surgery center.  FAILURE TO FOLLOW THESE INSTRUCTIONS MAY RESULT IN THE CANCELLATION OF  YOUR SURGERY PATIENT SIGNATURE_________________________________  NURSE SIGNATURE__________________________________  ________________________________________________________________________

## 2024-10-24 ENCOUNTER — Encounter (HOSPITAL_COMMUNITY): Admission: RE | Admit: 2024-10-24 | Discharge: 2024-10-24 | Attending: Urology | Admitting: Urology

## 2024-10-24 ENCOUNTER — Other Ambulatory Visit: Payer: Self-pay

## 2024-10-24 ENCOUNTER — Encounter (HOSPITAL_COMMUNITY): Payer: Self-pay

## 2024-10-24 VITALS — BP 116/75 | HR 55 | Temp 98.7°F | Resp 16 | Ht 65.0 in | Wt 125.0 lb

## 2024-10-24 DIAGNOSIS — Z87891 Personal history of nicotine dependence: Secondary | ICD-10-CM | POA: Diagnosis not present

## 2024-10-24 DIAGNOSIS — N189 Chronic kidney disease, unspecified: Secondary | ICD-10-CM | POA: Diagnosis not present

## 2024-10-24 DIAGNOSIS — Z79899 Other long term (current) drug therapy: Secondary | ICD-10-CM | POA: Diagnosis not present

## 2024-10-24 DIAGNOSIS — Z01818 Encounter for other preprocedural examination: Secondary | ICD-10-CM | POA: Diagnosis present

## 2024-10-24 DIAGNOSIS — N1330 Unspecified hydronephrosis: Secondary | ICD-10-CM | POA: Diagnosis not present

## 2024-10-24 DIAGNOSIS — F32A Depression, unspecified: Secondary | ICD-10-CM | POA: Diagnosis not present

## 2024-10-24 DIAGNOSIS — I129 Hypertensive chronic kidney disease with stage 1 through stage 4 chronic kidney disease, or unspecified chronic kidney disease: Secondary | ICD-10-CM | POA: Diagnosis not present

## 2024-10-24 DIAGNOSIS — M1990 Unspecified osteoarthritis, unspecified site: Secondary | ICD-10-CM | POA: Diagnosis not present

## 2024-10-24 DIAGNOSIS — Z01812 Encounter for preprocedural laboratory examination: Secondary | ICD-10-CM | POA: Diagnosis not present

## 2024-10-24 LAB — BASIC METABOLIC PANEL WITH GFR
Anion gap: 12 (ref 5–15)
BUN: 63 mg/dL — ABNORMAL HIGH (ref 8–23)
CO2: 22 mmol/L (ref 22–32)
Calcium: 9.7 mg/dL (ref 8.9–10.3)
Chloride: 105 mmol/L (ref 98–111)
Creatinine, Ser: 2.68 mg/dL — ABNORMAL HIGH (ref 0.44–1.00)
GFR, Estimated: 17 mL/min — ABNORMAL LOW (ref >60)
Glucose, Bld: 97 mg/dL (ref 70–99)
Potassium: 5.8 mmol/L — ABNORMAL HIGH (ref 3.5–5.1)
Sodium: 138 mmol/L (ref 135–145)

## 2024-10-24 LAB — HEMOGLOBIN A1C
Hgb A1c MFr Bld: 4.7 % — ABNORMAL LOW (ref 4.8–5.6)
Mean Plasma Glucose: 88.19 mg/dL

## 2024-10-24 LAB — GLUCOSE, CAPILLARY: Glucose-Capillary: 91 mg/dL (ref 70–99)

## 2024-10-24 LAB — CBC
HCT: 31 % — ABNORMAL LOW (ref 36.0–46.0)
Hemoglobin: 9.2 g/dL — ABNORMAL LOW (ref 12.0–15.0)
MCH: 28.4 pg (ref 26.0–34.0)
MCHC: 29.7 g/dL — ABNORMAL LOW (ref 30.0–36.0)
MCV: 95.7 fL (ref 80.0–100.0)
Platelets: 367 K/uL (ref 150–400)
RBC: 3.24 MIL/uL — ABNORMAL LOW (ref 3.87–5.11)
RDW: 14.7 % (ref 11.5–15.5)
WBC: 12 K/uL — ABNORMAL HIGH (ref 4.0–10.5)
nRBC: 0 % (ref 0.0–0.2)

## 2024-10-25 ENCOUNTER — Encounter (HOSPITAL_COMMUNITY): Payer: Self-pay

## 2024-10-25 MED ORDER — GENTAMICIN SULFATE 40 MG/ML IJ SOLN
5.0000 mg/kg | INTRAVENOUS | Status: DC
  Filled 2024-10-25: qty 7

## 2024-10-25 NOTE — Anesthesia Preprocedure Evaluation (Addendum)
 Anesthesia Evaluation  Patient identified by MRN, date of birth, ID band Patient awake    Reviewed: Allergy & Precautions, NPO status , Patient's Chart, lab work & pertinent test results, reviewed documented beta blocker date and time   History of Anesthesia Complications Negative for: history of anesthetic complications  Airway Mallampati: III  TM Distance: >3 FB Neck ROM: Full    Dental  (+) Lower Dentures, Upper Dentures   Pulmonary former smoker   Pulmonary exam normal        Cardiovascular hypertension, Pt. on medications and Pt. on home beta blockers Normal cardiovascular exam     Neuro/Psych  PSYCHIATRIC DISORDERS  Depression    negative neurological ROS     GI/Hepatic negative GI ROS, Neg liver ROS,,,  Endo/Other  diabetes (diet controlled), Well Controlled, Type 2   K 5.2   Renal/GU CRFRenal disease     Musculoskeletal  (+) Arthritis ,  Fibromyalgia -  Abdominal   Peds  Hematology  (+) Blood dyscrasia, anemia   Anesthesia Other Findings   Reproductive/Obstetrics                              Anesthesia Physical Anesthesia Plan  ASA: 3  Anesthesia Plan: General   Post-op Pain Management: Tylenol  PO (pre-op)*   Induction: Intravenous  PONV Risk Score and Plan: 3 and Treatment may vary due to age or medical condition, Ondansetron  and Propofol  infusion  Airway Management Planned: LMA  Additional Equipment: None  Intra-op Plan:   Post-operative Plan: Extubation in OR  Informed Consent: I have reviewed the patients History and Physical, chart, labs and discussed the procedure including the risks, benefits and alternatives for the proposed anesthesia with the patient or authorized representative who has indicated his/her understanding and acceptance.     Dental advisory given  Plan Discussed with: CRNA and Anesthesiologist  Anesthesia Plan Comments: (See PAT note  from 12/15 )         Anesthesia Quick Evaluation

## 2024-10-25 NOTE — Progress Notes (Signed)
 Case: [id number] Date/Time: 10/26/24 0815   Procedures:      CYSTOSCOPY, FLEXIBLE, WITH STENT REPLACEMENT (Right)     CYSTOSCOPY, WITH RETROGRADE PYELOGRAM (Bilateral)   Anesthesia type: General   Diagnosis: Hydronephrosis, unspecified hydronephrosis type [N13.30]   Pre-op diagnosis: Hydronephrosis of right kidney   Location: [facility] PROCEDURE [facility] / [facility]   Surgeons: Ashley Senior, MD       DISCUSSION: Ashley Dean is an 83 yo female with PMH of former smoking, hypertension, type 2 diabetes, CKD, fibromyalgia, depression, arthritis  Patient admitted from 11/17-11/23/25 for renal failure. 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 Her creatinine improved with treatment of urinary tract infection. ACE inhibitor and diuretic therapies were held. When she was discharged her SCr was 2.3  She followed up with Urology as outpatient on 12/3 and SCr was 2.57 with K of 5.5  Seen by PCP at [facility] on 10/13/24. She was advised to restart her hydrochlorothiazide due to leg swelling and elevated potassium.   Seen in pre op on 12/15. SCr 2.68. K is 5.8. Surgeon's office made aware. Discussed with patient's PCP, Dr. Claudene over the phone. She advised to make sure patient is not taking Lisinopril (pt verified she is not) and to hydrate with water  but not overhydrate. Patient states she does not feel SOB.  Will add I-stat Chem 8 for DOS.    VS: BP 116/75   Pulse (!) 55   Temp 37.1 C (Oral)   Resp 16   Ht 5' 5 (1.651 m)   Wt 56.7 kg   SpO2 100%   BMI 20.80 kg/m   PROVIDERS: Ashley Pellet, MD   LABS: Labs reviewed: Repeat I stat chem 8 (all labs ordered are listed, but only abnormal results are displayed)  Labs Reviewed  CBC - Abnormal; Notable for the following components:       Result Value   WBC 12.0 (*)    RBC 3.24 (*)    Hemoglobin 9.2 (*)    HCT 31.0 (*)    MCHC 29.7 (*)    All other components within normal limits  BASIC METABOLIC PANEL WITH GFR - Abnormal; Notable for the following components:   Potassium 5.8 (*)    BUN 63 (*)    Creatinine, Ser 2.68 (*)    GFR, Estimated 17 (*)    All other components within normal limits  HEMOGLOBIN A1C - Abnormal; Notable for the following components:   Hgb A1c MFr Bld 4.7 (*)    All other components within normal limits  GLUCOSE, CAPILLARY     CT abdomen/pelvis 09/26/24:  IMPRESSION: 1. Interval development of bilateral urothelial thickening with periureteric inflammatory stranding, mild hydronephrosis, and progressive distention of the dilated right renal pelvis, suggestive of bilateral ascending urinary tract infection; no ureteral calculi . no perinephric inflammatory stranding or fluid collection identified 2. Progressive circumferential bladder wall thickening with subtle perivesical inflammatory stranding, suggestive of infectious or inflammatory cystitis; correlation with urinalysis and urine culture may be helpful for further management 3. Stable 14 mm hyperdense exophytic right renal cyst, compatible with benign hyperdense cyst; no follow-up imaging recommended 4. Multiple bilateral nonobstructive punctate renal calculi, unchanged 5. raf score: Aortic atherosclerosis (icd10-i70.0)    EKG 09/27/2024:  Sinus rhythm PVCs   CV:  Past  Medical History:  Diagnosis Date   Arthritis    back   Depression    Diverticulosis of colon    Fibromyalgia    History of adenomatous polyp of colon    09-20-2004   History of kidney stones    History of pelvic mass    12-04-2015  s/p  lap. 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   History of urinary retention    hx post-op retention x 1 yrs ago none since   Hyperlipidemia    Hypertension    Left ureteral stone     Macular degeneration    Osteopenia    Type 2 diabetes mellitus (HCC)    Wears dentures    upper   Wears glasses     Past Surgical History:  Procedure Laterality Date   ANKLE SURGERY Bilateral    yrs ago   CATARACT EXTRACTION W/ INTRAOCULAR LENS  IMPLANT, BILATERAL  1992   CYSTOSCOPY W/ URETERAL STENT PLACEMENT Right 04/16/2022   Procedure: CYSTOSCOPY WITH RIGHT RETROGRADE PYELOGRAM/ right ureteroscopy/  URETERAL STENT PLACEMENT;  Surgeon: Ashley Ashley Righter, MD;  Location: [facility];  Service: Urology;  Laterality: Right;   CYSTOSCOPY WITH URETEROSCOPY AND STENT PLACEMENT Left 05/19/2017   Procedure: CYSTOSCOPY WITH URETEROSCOPY/ STONE EXTRACTION;  Surgeon: Ashley Rush, MD;  Location: [facility];  Service: Urology;  Laterality: Left;   CYSTOSCOPY/URETEROSCOPY/HOLMIUM LASER/STENT PLACEMENT Right 03/16/2024   Procedure: CYSTOSCOPY/URETEROSCOPY/STENT PLACEMENT/RETROGRADE;  Surgeon: Ashley Ashley Righter, MD;  Location: [facility];  Service: Urology;  Laterality: Right;  CYSTOSCOPY/RIGHT RETROGRADE PYELOGRAM/URETEROSCOPY/POSSIBLE HOLMIUM LASER/POSSIBLE STENT PLACEMENT   EXTRACORPOREAL SHOCK WAVE LITHOTRIPSY Left last one 11/08/2015   previously x2   LAPAROSCOPIC CHOLECYSTECTOMY     04-09-2005   dr toth   LAPAROSCOPY W/ BILATERAL SALPINGOOPHORECTOMY   12-04-2015   at [facility])   NEPHROLITHOTOMY  1978   ORIF RIGHT ANKLE FX  11/09/2008   TONSILLECTOMY  child   VAGINAL HYSTERECTOMY  1978    MEDICATIONS:  hydrochlorothiazide (HYDRODIURIL) 25 MG tablet   OVER THE COUNTER MEDICATION   OVER THE COUNTER MEDICATION   atenolol  (TENORMIN ) 50 MG tablet   beta carotene w/minerals (OCUVITE) tablet   DULoxetine (CYMBALTA) 30 MG capsule   fluconazole  (DIFLUCAN ) 100 MG tablet   methenamine  (HIPREX ) 1 g tablet   Multiple Vitamins-Minerals (MULTIVITAMIN WITH MINERALS) tablet   oxyCODONE -acetaminophen  (PERCOCET) 10-325 MG tablet   phenazopyridine  (PYRIDIUM )  200 MG tablet   psyllium (METAMUCIL) 58.6 % packet   rosuvastatin  (CRESTOR ) 10 MG tablet   solifenacin  (VESICARE ) 10 MG tablet    [START ON 10/26/2024] gentamicin  (GARAMYCIN ) 280 mg in dextrose  5 % 100 mL IVPB   Ashley Dean Odis DEVONNA [facility] Short Stay/Anesthesiology [facility] Phone [phone] 10/25/2024 8:54 AM

## 2024-10-26 ENCOUNTER — Ambulatory Visit (HOSPITAL_COMMUNITY)
Admission: RE | Admit: 2024-10-26 | Discharge: 2024-10-26 | Disposition: A | Payer: PRIVATE HEALTH INSURANCE | Attending: Urology | Admitting: Urology

## 2024-10-26 ENCOUNTER — Ambulatory Visit (HOSPITAL_COMMUNITY): Payer: Self-pay | Admitting: Medical

## 2024-10-26 ENCOUNTER — Encounter: Admission: RE | Disposition: A | Payer: Self-pay | Attending: Urology

## 2024-10-26 ENCOUNTER — Ambulatory Visit (HOSPITAL_COMMUNITY)

## 2024-10-26 ENCOUNTER — Encounter (HOSPITAL_COMMUNITY): Payer: Self-pay | Admitting: Urology

## 2024-10-26 ENCOUNTER — Encounter (HOSPITAL_COMMUNITY): Payer: Self-pay | Admitting: Medical

## 2024-10-26 DIAGNOSIS — Z8619 Personal history of other infectious and parasitic diseases: Secondary | ICD-10-CM | POA: Diagnosis not present

## 2024-10-26 DIAGNOSIS — N1831 Chronic kidney disease, stage 3a: Secondary | ICD-10-CM

## 2024-10-26 DIAGNOSIS — I129 Hypertensive chronic kidney disease with stage 1 through stage 4 chronic kidney disease, or unspecified chronic kidney disease: Secondary | ICD-10-CM | POA: Diagnosis not present

## 2024-10-26 DIAGNOSIS — E119 Type 2 diabetes mellitus without complications: Secondary | ICD-10-CM | POA: Insufficient documentation

## 2024-10-26 DIAGNOSIS — N1330 Unspecified hydronephrosis: Secondary | ICD-10-CM

## 2024-10-26 DIAGNOSIS — I10 Essential (primary) hypertension: Secondary | ICD-10-CM | POA: Insufficient documentation

## 2024-10-26 DIAGNOSIS — Z87440 Personal history of urinary (tract) infections: Secondary | ICD-10-CM | POA: Diagnosis not present

## 2024-10-26 DIAGNOSIS — Z01818 Encounter for other preprocedural examination: Secondary | ICD-10-CM

## 2024-10-26 DIAGNOSIS — Z87891 Personal history of nicotine dependence: Secondary | ICD-10-CM | POA: Diagnosis not present

## 2024-10-26 DIAGNOSIS — N179 Acute kidney failure, unspecified: Secondary | ICD-10-CM

## 2024-10-26 DIAGNOSIS — N289 Disorder of kidney and ureter, unspecified: Secondary | ICD-10-CM | POA: Diagnosis not present

## 2024-10-26 DIAGNOSIS — E1122 Type 2 diabetes mellitus with diabetic chronic kidney disease: Secondary | ICD-10-CM | POA: Diagnosis not present

## 2024-10-26 HISTORY — PX: CYSTOSCOPY W/ RETROGRADES: SHX1426

## 2024-10-26 LAB — POCT I-STAT, CHEM 8
BUN: 69 mg/dL — ABNORMAL HIGH (ref 8–23)
Calcium, Ion: 1.24 mmol/L (ref 1.15–1.40)
Chloride: 106 mmol/L (ref 98–111)
Creatinine, Ser: 3.1 mg/dL — ABNORMAL HIGH (ref 0.44–1.00)
Glucose, Bld: 110 mg/dL — ABNORMAL HIGH (ref 70–99)
HCT: 29 % — ABNORMAL LOW (ref 36.0–46.0)
Hemoglobin: 9.9 g/dL — ABNORMAL LOW (ref 12.0–15.0)
Potassium: 5.2 mmol/L — ABNORMAL HIGH (ref 3.5–5.1)
Sodium: 137 mmol/L (ref 135–145)
TCO2: 21 mmol/L — ABNORMAL LOW (ref 22–32)

## 2024-10-26 LAB — GLUCOSE, CAPILLARY: Glucose-Capillary: 100 mg/dL — ABNORMAL HIGH (ref 70–99)

## 2024-10-26 SURGERY — CYSTOSCOPY, WITH RETROGRADE PYELOGRAM
Anesthesia: General | Laterality: Bilateral

## 2024-10-26 MED ORDER — PROPOFOL 10 MG/ML IV BOLUS
INTRAVENOUS | Status: AC
  Filled 2024-10-26: qty 20

## 2024-10-26 MED ORDER — FENTANYL CITRATE (PF) 50 MCG/ML IJ SOSY: 25.0000 ug | PREFILLED_SYRINGE | INTRAMUSCULAR | Status: DC | PRN

## 2024-10-26 MED ORDER — PROPOFOL 10 MG/ML IV BOLUS
INTRAVENOUS | Status: DC | PRN
  Administered 2024-10-26: 09:00:00 100 mg via INTRAVENOUS

## 2024-10-26 MED ORDER — ONDANSETRON HCL 4 MG/2ML IJ SOLN
INTRAMUSCULAR | Status: DC | PRN
  Administered 2024-10-26: 09:00:00 4 mg via INTRAVENOUS

## 2024-10-26 MED ORDER — OXYCODONE HCL 5 MG PO TABS: 5.0000 mg | ORAL_TABLET | Freq: Once | ORAL | Status: DC | PRN

## 2024-10-26 MED ORDER — STERILE WATER FOR IRRIGATION IR SOLN
Status: DC | PRN
  Administered 2024-10-26: 09:00:00 3000 mL

## 2024-10-26 MED ORDER — CEPHALEXIN 500 MG PO CAPS: 500.0000 mg | ORAL_CAPSULE | Freq: Two times a day (BID) | ORAL | 0 refills | Status: AC

## 2024-10-26 MED ORDER — CHLORHEXIDINE GLUCONATE 0.12 % MT SOLN
15.0000 mL | Freq: Once | OROMUCOSAL | Status: AC
  Administered 2024-10-26: 07:00:00 15 mL via OROMUCOSAL

## 2024-10-26 MED ORDER — ONDANSETRON HCL 4 MG/2ML IJ SOLN: 4.0000 mg | Freq: Once | INTRAMUSCULAR | Status: DC | PRN

## 2024-10-26 MED ORDER — DEXAMETHASONE SOD PHOSPHATE PF 10 MG/ML IJ SOLN
INTRAMUSCULAR | Status: DC | PRN
  Administered 2024-10-26: 09:00:00 10 mg via INTRAVENOUS

## 2024-10-26 MED ORDER — PHENYLEPHRINE 80 MCG/ML (10ML) SYRINGE FOR IV PUSH (FOR BLOOD PRESSURE SUPPORT)
PREFILLED_SYRINGE | INTRAVENOUS | Status: DC | PRN
  Administered 2024-10-26: 09:00:00 80 ug via INTRAVENOUS

## 2024-10-26 MED ORDER — FENTANYL CITRATE (PF) 100 MCG/2ML IJ SOLN
INTRAMUSCULAR | Status: DC | PRN
  Administered 2024-10-26 (×2): 50 ug via INTRAVENOUS

## 2024-10-26 MED ORDER — SODIUM CHLORIDE 0.9 % IV SOLN: INTRAVENOUS | Status: DC

## 2024-10-26 MED ORDER — OXYCODONE HCL 5 MG/5ML PO SOLN: 5.0000 mg | Freq: Once | ORAL | Status: DC | PRN

## 2024-10-26 MED ORDER — LIDOCAINE 2% (20 MG/ML) 5 ML SYRINGE
INTRAMUSCULAR | Status: DC | PRN
  Administered 2024-10-26: 09:00:00 40 mg via INTRAVENOUS

## 2024-10-26 MED ORDER — ORAL CARE MOUTH RINSE: 15.0000 mL | Freq: Once | OROMUCOSAL | Status: AC

## 2024-10-26 MED ORDER — ACETAMINOPHEN 500 MG PO TABS
1000.0000 mg | ORAL_TABLET | Freq: Once | ORAL | Status: AC
  Administered 2024-10-26: 07:00:00 1000 mg via ORAL
  Filled 2024-10-26: qty 2

## 2024-10-26 MED ORDER — INSULIN ASPART 100 UNIT/ML IJ SOLN: 0.0000 [IU] | INTRAMUSCULAR | Status: DC | PRN

## 2024-10-26 MED ORDER — IOHEXOL 300 MG/ML  SOLN
INTRAMUSCULAR | Status: DC | PRN
  Administered 2024-10-26: 09:00:00 30 mL

## 2024-10-26 MED ORDER — LACTATED RINGERS IV SOLN: INTRAVENOUS | Status: DC

## 2024-10-26 MED ORDER — FENTANYL CITRATE (PF) 100 MCG/2ML IJ SOLN
INTRAMUSCULAR | Status: AC
  Filled 2024-10-26: qty 2

## 2024-10-26 SURGICAL SUPPLY — 14 items
BAG URINE DRAIN 2000ML AR STRL (UROLOGICAL SUPPLIES) IMPLANT
BAG URO CATCHER STRL LF (MISCELLANEOUS) ×1 IMPLANT
CATH FOLEY 2WAY SLVR 5CC 14FR (CATHETERS) IMPLANT
CATH URETL OPEN END 6FR 70 (CATHETERS) ×1 IMPLANT
CLOTH BEACON ORANGE TIMEOUT ST (SAFETY) ×1 IMPLANT
GLOVE SURG LX STRL 8.0 MICRO (GLOVE) ×1 IMPLANT
GOWN STRL REUS W/ TWL XL LVL3 (GOWN DISPOSABLE) ×2 IMPLANT
GUIDEWIRE ANG ZIPWIRE 038X150 (WIRE) IMPLANT
GUIDEWIRE STR DUAL SENSOR (WIRE) ×1 IMPLANT
KIT TURNOVER KIT A (KITS) ×1 IMPLANT
MANIFOLD NEPTUNE II (INSTRUMENTS) ×1 IMPLANT
PACK CYSTO (CUSTOM PROCEDURE TRAY) ×1 IMPLANT
STENT URET 6FRX24 CONTOUR (STENTS) IMPLANT
TUBING CONNECTING 10 (TUBING) ×1 IMPLANT

## 2024-10-26 NOTE — Op Note (Signed)
 Preoperative diagnosis: Right sided hydronephrosis with acute renal insufficiency  Postoperative diagnosis: Bilateral hydronephrosis  Principal procedure: Cystoscopy, bilateral retrograde ureteropyelogram's, fluoroscopic interpretation, placement of bilateral double-J stents-6 French by 24 cm without tether  Surgeon: Cassell Voorhies  Anesthesia: General With LMA  Complications: None  Specimen: Urine for culture  Estimated blood loss: None  Indications: 83 year old female with worsening right hydronephrosis, first felt to have been ureteropelvic junction obstruction.  She has come up to this point, had a thorough evaluation and treatment by Dr. Devere.  This has included ureteroscopy and short-term stent placement.  Recent hospitalization for urinary tract infection with sepsis included CT abdomen and pelvis.  This revealed right sided hydroureteronephrosis all the way to the bladder, as well as some left-sided pyelocaliectasis.  As she has worsening renal insufficiency, she presents at this time for repeat cystoscopy, retrograde pyelograms bilaterally and, if necessary, stent placement.  Findings: Urethra was normal.  Bladder, upon drainage, had purulent urine.  This was sent for culture.  No lesions were noted on her urothelium but she had patchy erythematous areas consistent with cystitis.  Her ureteral orifice ease were golf-hole in nature, somewhat placed more lateral than normal.  Retrograde studies were performed bilaterally.  The right ureter and upper tracts were significantly dilated.  As she has had previous ureteroscopic inspection of the right renal pelvic and calyceal areas, I did not necessarily feel the pyelocalyceal system with the contrast.  There was left hydroureter and pyelocaliectasis, also significant.  Tortuosity of both ureteropelvic junctions were noted.  Description of procedure: The patient was properly identified in the holding area.  She received preoperative IV  antibiotics.  She was transported to the operating room where general anesthetic was administered.  She was placed in the dorsolithotomy position.  Genitalia and perineum were prepped, draped, proper timeout performed.  21 French panendoscope advanced into her bladder.  Systematic inspection of the bladder was then performed.  Significant finding were the abnormal locations of the ureters bilaterally.  Significant right golf-hole orifice, mild changes of golf-hole appearance also to the left.  Bilateral retrograde ureteropyelogram's were performed separately, with the above-mentioned findings.  Following retrograde pyelograms, both ureters were cannulated with a 0.038 inch sensor tip guidewires.  Easy access to the upper poles bilaterally.  After adequate placement of the guidewires, stents were placed over top of these, loaded into the ureters and once positioned adequately were deployed with removal of the guidewire.  Excellent proximal and distal curls were seen bilaterally.  At this point, the bladder was drained.  Because of her bilateral hydro, and her renal insufficiency, I felt that at least temporary drainage with a Foley catheter was called for.  14 French Foley was placed, hooked to dependent drainage with a bedside bag.  At this point the procedure was terminated.  She was awakened, taken to the PACU in stable condition, having tolerated procedure well.

## 2024-10-26 NOTE — Progress Notes (Signed)
 [id number] I stat-8 glucose 110 no coverage.

## 2024-10-26 NOTE — H&P (Signed)
 H&P  Chief Complaint: Blocked Rt kidney  History of Present Illness: Ashley Dean is a 83 y.o. year old female here for cysto/(B) RGPs as well as Rt ureteral stent placement for Rt hydro.  Past Medical History:  Diagnosis Date   Arthritis    back   Depression    Diverticulosis of colon    Fibromyalgia    History of adenomatous polyp of colon    09-20-2004   History of kidney stones    History of pelvic mass    12-04-2015  s/p  lap. 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   History of urinary retention    hx post-op retention x 1 yrs ago none since   Hyperlipidemia    Hypertension    Left ureteral stone    Macular degeneration    Osteopenia    Type 2 diabetes mellitus (HCC)    Wears dentures    upper   Wears glasses     Past Surgical History:  Procedure Laterality Date   ANKLE SURGERY Bilateral    yrs ago   CATARACT EXTRACTION W/ INTRAOCULAR LENS  IMPLANT, BILATERAL  1992   CYSTOSCOPY W/ URETERAL STENT PLACEMENT Right 04/16/2022   Procedure: CYSTOSCOPY WITH RIGHT RETROGRADE PYELOGRAM/ right ureteroscopy/  URETERAL STENT PLACEMENT;  Surgeon: Devere Lonni Righter, MD;  Location: [facility];  Service: Urology;  Laterality: Right;   CYSTOSCOPY WITH URETEROSCOPY AND STENT PLACEMENT Left 05/19/2017   Procedure: CYSTOSCOPY WITH URETEROSCOPY/ STONE EXTRACTION;  Surgeon: Watt Rush, MD;  Location: [facility];  Service: Urology;  Laterality: Left;   CYSTOSCOPY/URETEROSCOPY/HOLMIUM LASER/STENT PLACEMENT Right 03/16/2024   Procedure: CYSTOSCOPY/URETEROSCOPY/STENT PLACEMENT/RETROGRADE;  Surgeon: Devere Lonni Righter, MD;  Location: [facility];  Service: Urology;  Laterality: Right;  CYSTOSCOPY/RIGHT RETROGRADE PYELOGRAM/URETEROSCOPY/POSSIBLE HOLMIUM LASER/POSSIBLE STENT PLACEMENT   EXTRACORPOREAL SHOCK WAVE LITHOTRIPSY Left last one 11/08/2015   previously x2   LAPAROSCOPIC CHOLECYSTECTOMY     04-09-2005   dr toth    LAPAROSCOPY W/ BILATERAL SALPINGOOPHORECTOMY   12-04-2015   at [facility])   NEPHROLITHOTOMY  1978   ORIF RIGHT ANKLE FX  11/09/2008   TONSILLECTOMY  child   VAGINAL HYSTERECTOMY  1978    Home Medications:  Medications Prior to Admission  Medication Sig Dispense Refill   atenolol  (TENORMIN ) 50 MG tablet Take 12.5 mg by mouth every morning. 0.25 tablet     beta carotene w/minerals (OCUVITE) tablet Take 1 tablet by mouth in the morning.     DULoxetine (CYMBALTA) 30 MG capsule Take 90 mg by mouth at bedtime.     fluconazole  (DIFLUCAN ) 100 MG tablet Take 1 tablet (100 mg total) by mouth daily. X 7 days 10 tablet 0   hydrochlorothiazide (HYDRODIURIL) 25 MG tablet Take 25 mg by mouth daily. PT is taking 1/2  tablet every day in the am .     methenamine  (HIPREX ) 1 g tablet Take 1 tablet (1 g total) by mouth 2 (two) times daily with a meal. (Patient taking differently: Take 1 g by mouth 2 (two) times daily with a meal. Pt states she is unable to get from drug store.) 60 tablet 11   Multiple Vitamins-Minerals (MULTIVITAMIN WITH MINERALS) tablet Take 1 tablet by mouth daily.     OVER THE COUNTER MEDICATION Calcium  tablet every day in am per pt     OVER THE COUNTER MEDICATION Probiotic one daily     oxyCODONE -acetaminophen  (PERCOCET) 10-325 MG tablet Take 1 tablet by mouth  3 (three) times daily as needed for pain.     phenazopyridine  (PYRIDIUM ) 200 MG tablet Take 1 tablet (200 mg total) by mouth 3 (three) times daily as needed for pain. 90 tablet 0   psyllium (METAMUCIL) 58.6 % packet Take 1 packet by mouth daily as needed (constipation.). Pt states she has not currently had to take     rosuvastatin  (CRESTOR ) 10 MG tablet Take 10 mg by mouth at bedtime.     solifenacin  (VESICARE ) 10 MG tablet Take 1 tablet (10 mg total) by mouth daily. 30 tablet 11    Allergies: Allergies[1]  History reviewed. No pertinent family history.  Social History:  reports that she quit smoking about 21 years ago. Her  smoking use included cigarettes. She started smoking about 41 years ago. She has a 30 pack-year smoking history. She has never been exposed to tobacco smoke. She has never used smokeless tobacco. She reports that she does not drink alcohol and does not use drugs.  ROS: A complete review of systems was performed.  All systems are negative except for pertinent findings as noted.  Physical Exam:  Vital signs in last 24 hours: Temp:  [97.9 F (36.6 C)] 97.9 F (36.6 C) (12/17 0650) Pulse Rate:  [56] 56 (12/17 0650) Resp:  [16] 16 (12/17 0650) BP: (131)/(54) 131/54 (12/17 0650) SpO2:  [97 %] 97 % (12/17 0650) Weight:  [56.7 kg] 56.7 kg (12/17 0718) General:  Alert and oriented, No acute distress HEENT: Normocephalic, atraumatic Neck: No JVD or lymphadenopathy Cardiovascular: Regular rate  Lungs: Normal inspiratory/expiratory excursion Abdomen: Soft, nontender, nondistended, no abdominal masses Back: No CVA tenderness Extremities: No edema Neurologic: Grossly intact  I have reviewed prior pt notes  I have reviewed notes from referring/previous physicians  I have reviewed urinalysis results  I have independently reviewed prior imaging  I have reviewed prior urine culture   Impression/Assessment:  Rt hydro  Plan:  Cysto, (B) RGPs, Rt J2 stent  Garnette HERO Nevaeh Casillas 10/26/2024, 8:33 AM  Garnette HERO. Riddhi Grether MD      [1]  Allergies Allergen Reactions   Nsaids Other (See Comments)    Renal insufficiency   Macrodantin [Nitrofurantoin Macrocrystal] Rash

## 2024-10-26 NOTE — Anesthesia Procedure Notes (Signed)
 Procedure Name: LMA Insertion Date/Time: 10/26/2024 8:48 AM  Performed by: Cindie Charleen PARAS, CRNAPre-anesthesia Checklist: Patient identified, Emergency Drugs available, Suction available, Patient being monitored and Timeout performed Patient Re-evaluated:Patient Re-evaluated prior to induction Oxygen Delivery Method: Circle system utilized Preoxygenation: Pre-oxygenation with 100% oxygen Induction Type: IV induction Ventilation: Mask ventilation without difficulty LMA: LMA inserted LMA Size: 4.0 Number of attempts: 1 Placement Confirmation: positive ETCO2 and breath sounds checked- equal and bilateral Tube secured with: Tape Dental Injury: Teeth and Oropharynx as per pre-operative assessment

## 2024-10-26 NOTE — Discharge Instructions (Signed)
You may see some blood in the urine and may have some burning with urination for 48-72 hours. You also may notice that you have to urinate more frequently or urgently after your procedure which is normal.  You should call should you develop an inability urinate, fever > 101, persistent nausea and vomiting that prevents you from eating or drinking to stay hydrated.  If you have a stent, you will likely urinate more frequently and urgently until the stent is removed and you may experience some discomfort/pain in the lower abdomen and flank especially when urinating. You may take pain medication prescribed to you if needed for pain. You may also intermittently have blood in the urine until the stent is removed. If you have a catheter, you will be taught how to take care of the catheter by the nursing staff prior to discharge from the hospital.  You may periodically feel a strong urge to void with the catheter in place.  This is a bladder spasm and most often can occur when having a bowel movement or moving around. It is typically self-limited and usually will stop after a few minutes.  You may use some Vaseline or Neosporin around the tip of the catheter to reduce friction at the tip of the penis. You may also see some blood in the urine.  A very small amount of blood can make the urine look quite red.  As long as the catheter is draining well, there usually is not a problem.  However, if the catheter is not draining well and is bloody, you should call the office ([phone]) to notify us.    

## 2024-10-26 NOTE — Anesthesia Postprocedure Evaluation (Signed)
 Anesthesia Post Note  Patient: Ashley Dean  Procedure(s) Performed: CYSTOSCOPY, WITH RETROGRADE PYELOGRAM AND BILATERAL STENT PLACEMENT (Bilateral)     Patient location during evaluation: PACU Anesthesia Type: General Level of consciousness: awake and alert Pain management: pain level controlled Vital Signs Assessment: post-procedure vital signs reviewed and stable Respiratory status: spontaneous breathing, nonlabored ventilation and respiratory function stable Cardiovascular status: stable, blood pressure returned to baseline and bradycardic Anesthetic complications: no   No notable events documented.  Last Vitals:  Vitals:   10/26/24 0923 10/26/24 0930  BP: 139/72 (!) 145/63  Pulse: (!) 53 (!) 51  Resp: 14 14  Temp: (!) 36.2 C   SpO2: 100% 100%    Last Pain:  Vitals:   10/26/24 0930  TempSrc:   PainSc: 0-No pain                 Debby FORBES Like

## 2024-10-26 NOTE — Transfer of Care (Signed)
 Immediate Anesthesia Transfer of Care Note  Patient: Ashley Dean  Procedure(s) Performed: CYSTOSCOPY, WITH RETROGRADE PYELOGRAM AND BILATERAL STENT PLACEMENT (Bilateral)  Patient Location: PACU  Anesthesia Type:General  Level of Consciousness: sedated, patient cooperative, and responds to stimulation  Airway & Oxygen Therapy: Patient connected to face mask oxygen  Post-op Assessment: Report given to RN and Post -op Vital signs reviewed and stable  Post vital signs: Reviewed and stable  Last Vitals:  Vitals Value Taken Time  BP 139/72 10/26/24 09:23  Temp    Pulse 55 10/26/24 09:26  Resp 14 10/26/24 09:26  SpO2 100 % 10/26/24 09:26  Vitals shown include unfiled device data.  Last Pain:  Vitals:   10/26/24 0718  TempSrc:   PainSc: 0-No pain         Complications: No notable events documented.

## 2024-10-27 ENCOUNTER — Encounter (HOSPITAL_COMMUNITY): Payer: Self-pay | Admitting: Urology

## 2024-10-27 LAB — URINE CULTURE: Culture: NO GROWTH

## 2024-11-07 ENCOUNTER — Telehealth: Payer: Self-pay

## 2024-11-07 ENCOUNTER — Encounter: Payer: PRIVATE HEALTH INSURANCE | Admitting: Urology

## 2024-11-07 NOTE — Telephone Encounter (Signed)
-----   Message from Garnette Shack, MD sent at 11/07/2024 12:04 PM EST ----- This is the message I got.

## 2024-11-07 NOTE — Telephone Encounter (Signed)
 Attempted to reach pt to follow up on after hours call received on 11/04/24. Pt did not answer and VM is full, unable to leave a message.

## 2024-11-08 ENCOUNTER — Telehealth: Payer: Self-pay

## 2024-11-08 NOTE — Telephone Encounter (Signed)
-----   Message from Garnette Shack, MD sent at 11/07/2024 12:03 PM EST ----- I got a message that this woman does not take care of her catheter bag.  Also that she needs a prescription for methenamine .  Since she has a catheter, methenamine  is not necessary.  It is okay to call her home health care and give an order for management of her catheter bags.

## 2024-11-08 NOTE — Telephone Encounter (Signed)
 Spoke w/ Amy, pts physical therapist. Gave verbal orders for management of catheter bags and advised Amy pt should not be taking the methenamine  with a foley in place. Amy states pt has yet to pick up the methenamine .

## 2024-11-09 NOTE — Progress Notes (Signed)
 "    Assessment: -Bilateral hydronephrosis.  I think more than likely related to dysfunctional bladder.  She has been stented and has had an indwelling catheter  -Noncompliant bladder with bilateral hydronephrosis, relatively new [place] onset  -Thickened urothelium/ureteral walls bilaterally, perhaps due to recurrent infections  -Recurrent urinary tract infections    Plan: 1.  Catheter was removed today.  She had a BMP drawn  2.  She was put on amoxicillin  suppression  3.  I will arrange for her to have urodynamics at [facility] urology, call with results and follow-up   History of Present Illness:  83 year old female presents to our office for the first time for evaluation and management of recurrent urinary tract infections as well as right hydronephrosis.  She has a history of kidney stones.  She underwent ureteroscopic stone management of a left distal ureteral stone in July 2018.  She had a left renal stone treated with shockwave lithotripsy in December 2016.  In July, 2022 she underwent CT chest/abdomen/pelvis which showed no renal calculi, but mild dilatation of the right renal pelvis and renal scarring bilaterally.  Workup in 2023 was performed because of worsening right hydronephrosis.  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  There was a small left renal stone.  There was also a previously seen 1.2 cm simple cyst, exophytic, on the right lower pole.  She had Lasix  renogram which showed findings consistent with obstruction.  6.7.2023: Underwent cystoscopy, right retrograde, right ureteroscopy and ureteral stent by Dr. Devere.  This revealed right UPJ narrowing with mild to moderate dilatation of the right renal pelvis and calyces.  No upper tract lesions seen.  No intravesical abnormality seen.  Stent with tether placed.  Serum creatinine 0.6, GFR 8780  2.8.2024: Routine follow-up.  At that time, creatinine 1.0, GFR 53.  There was severe right hydro on  ultrasound.  Urinalysis was clear.  9.18.2024: Hydronephrosis confirmed on repeat renal ultrasound.  Creatinine 0.7, GFR 81  4.17.2025: Routine follow-up.  Stable hydro noted on ultrasound.  Postvoid residual 23 mL.  Serum creatinine 1.2, GFR 42  5.7.2025: Repeat cystoscopy, retrograde, right ureteroscopy.  No stenosis of right UPJ noted.  No urothelial abnormalities noted.  11.17.2025-11.23.2025: Admission to the hospital in [facility] for acute renal insufficiency with a creatinine of 5.6.  Urinalysis was consistent with UTI.  She had ureteral thickening and right hydroureteronephrosis on CT scan.  CT also revealed thickened bladder wall.  Urologic consultation was provided.  Foley catheter was placed for short time, when removed she voided adequately.  Urine culture grew multiples PACs.  With the patient's creatinine improving with hydration and bladder drainage, creatinine improved.  Although stenting was recommended eventually, that was not performed during her hospitalization.  12.3.2025: First [place] visit for this woman.  She has been feeling better since she was discharged from the hospital.  She does have urinary frequency, urgency and dysuria.  She has been treated for multiple urinary tract infections.  Her urine culture did grow yeast.  12.17.2025: Underwent cystoscopy, bilateral retrograde pyelograms, placement of stents bilaterally..  There was obvious abnormalities with her ureteral orifices bilaterally.  They were golf-hole configuration.  She had significant ureterectasis bilaterally.  Obvious distention of both collecting systems.  There was no evidence of obstruction, it was felt that the patient had significant reflux.  1.5.2025: Here today for follow-up.  She is not comfortable with the indwelling catheter at this time, she states it is uncomfortable to sit.  She  is on solifenacin .  Not on any suppressive antibiotics.  No recent infections.   Past Medical History:  Past  Medical History:  Diagnosis Date   Arthritis    back   Depression    Diverticulosis of colon    Fibromyalgia    History of adenomatous polyp of colon    09-20-2004   History of kidney stones    History of pelvic mass    12-04-2015  s/p  lap. 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   History of urinary retention    hx post-op retention x 1 yrs ago none since   Hyperlipidemia    Hypertension    Left ureteral stone    Macular degeneration    Osteopenia    Type 2 diabetes mellitus (HCC)    Wears dentures    upper   Wears glasses     Past Surgical History:  Past Surgical History:  Procedure Laterality Date   ANKLE SURGERY Bilateral    yrs ago   CATARACT EXTRACTION W/ INTRAOCULAR LENS  IMPLANT, BILATERAL  1992   CYSTOSCOPY W/ RETROGRADES Bilateral 10/26/2024   Procedure: CYSTOSCOPY, WITH RETROGRADE PYELOGRAM AND BILATERAL STENT PLACEMENT;  Surgeon: Matilda Senior, MD;  Location: [facility];  Service: Urology;  Laterality: Bilateral;   CYSTOSCOPY W/ URETERAL STENT PLACEMENT Right 04/16/2022   Procedure: CYSTOSCOPY WITH RIGHT RETROGRADE PYELOGRAM/ right ureteroscopy/  URETERAL STENT PLACEMENT;  Surgeon: Devere Lonni Righter, MD;  Location: [facility];  Service: Urology;  Laterality: Right;   CYSTOSCOPY WITH URETEROSCOPY AND STENT PLACEMENT Left 05/19/2017   Procedure: CYSTOSCOPY WITH URETEROSCOPY/ STONE EXTRACTION;  Surgeon: Watt Rush, MD;  Location: [facility];  Service: Urology;  Laterality: Left;   CYSTOSCOPY/URETEROSCOPY/HOLMIUM LASER/STENT PLACEMENT Right 03/16/2024   Procedure: CYSTOSCOPY/URETEROSCOPY/STENT PLACEMENT/RETROGRADE;  Surgeon: Devere Lonni Righter, MD;  Location: [facility];  Service: Urology;  Laterality: Right;  CYSTOSCOPY/RIGHT RETROGRADE PYELOGRAM/URETEROSCOPY/POSSIBLE HOLMIUM LASER/POSSIBLE STENT PLACEMENT   EXTRACORPOREAL SHOCK WAVE LITHOTRIPSY Left last one 11/08/2015   previously x2    LAPAROSCOPIC CHOLECYSTECTOMY     04-09-2005   dr toth   LAPAROSCOPY W/ BILATERAL SALPINGOOPHORECTOMY   12-04-2015   at [facility])   NEPHROLITHOTOMY  1978   ORIF RIGHT ANKLE FX  11/09/2008   TONSILLECTOMY  child   VAGINAL HYSTERECTOMY  1978    Allergies:  Allergies  Allergen Reactions   Nsaids Other (See Comments)    Renal insufficiency   Macrodantin [Nitrofurantoin Macrocrystal] Rash    Family History:  No family history on file.  Social History:  Social History   Tobacco Use   Smoking status: Former    Current packs/day: 0.00    Average packs/day: 1.5 packs/day for 20.0 years (30.0 ttl pk-yrs)    Types: Cigarettes    Start date: 11/10/1982    Quit date: 11/10/2002    Years since quitting: 22.0    Passive exposure: Never   Smokeless tobacco: Never  Vaping Use   Vaping status: Never Used  Substance Use Topics   Alcohol use: No   Drug use: No    Comment: no    Review of symptoms:  Constitutional:  Negative for unexplained weight loss, night sweats, fever, chills ENT:  Negative for nose bleeds, sinus pain, painful swallowing CV:  Negative for chest pain, shortness of breath, exercise intolerance, palpitations, loss of consciousness Resp:  Negative for cough, wheezing, shortness of breath GI:  Negative for nausea, vomiting, diarrhea, bloody stools GU:  Positives noted in  HPI; otherwise negative for gross hematuria, dysuria, urinary incontinence Neuro:  Negative for seizures, poor balance, limb weakness, slurred speech Psych:  Negative for lack of energy, depression, anxiety Endocrine:  Negative for polydipsia, polyuria, symptoms of hypoglycemia (dizziness, hunger, sweating) Hematologic:  Negative for anemia, purpura, petechia, prolonged or excessive bleeding, use of anticoagulants  Allergic:  Negative for difficulty breathing or choking as a result of exposure to anything; no shellfish allergy; no allergic response (rash/itch) to materials, foods  Physical  exam: There were no vitals taken for this visit. GENERAL APPEARANCE:  Well appearing, well developed, well nourished, NAD HEENT: Atraumatic, Normocephalic. NECK: Normal appearance LUNGS: Normal inspiratory and expiratory excursion HEART: Regular Rate EXTREMITIES: Moves all extremities well.  Without clubbing, cyanosis.  Peripheral edema noted NEUROLOGIC:  Alert and oriented x 3, normal gait, CN II-XII grossly intact.  MENTAL STATUS:  Appropriate. SKIN:  Warm, dry and intact.    Results:  I have reviewed prior patient's records-[facility], epic notes  I have reviewed referring/prior physicians records  I have reviewed urinalysis from today  I have reviewed prior urine cultures  I reviewed prior imaging studies--CT from 11.16.2025- 1. Interval development of bilateral urothelial thickening with periureteric inflammatory stranding, mild hydronephrosis, and progressive distention of the dilated right renal pelvis, suggestive of bilateral ascending urinary tract infection; no ureteral calculi . no perinephric inflammatory stranding or fluid collection identified 2. Progressive circumferential bladder wall thickening with subtle perivesical inflammatory stranding, suggestive of infectious or inflammatory cystitis; correlation with urinalysis and urine culture may be helpful for further management 3. Stable 14 mm hyperdense exophytic right renal cyst, compatible with benign hyperdense cyst; no follow-up imaging recommended 4. Multiple bilateral nonobstructive punctate renal calculi, unchanged 5. raf score: Aortic atherosclerosis ([id number])   I also held conversation with Dr. Lonni Han at [facility], who was her prior urologist in [place].    "

## 2024-11-14 ENCOUNTER — Ambulatory Visit: Payer: PRIVATE HEALTH INSURANCE | Admitting: Urology

## 2024-11-14 VITALS — BP 106/59 | HR 68 | Ht 65.0 in | Wt 125.0 lb

## 2024-11-14 DIAGNOSIS — N1330 Unspecified hydronephrosis: Secondary | ICD-10-CM | POA: Diagnosis not present

## 2024-11-14 DIAGNOSIS — N390 Urinary tract infection, site not specified: Secondary | ICD-10-CM

## 2024-11-14 DIAGNOSIS — N3289 Other specified disorders of bladder: Secondary | ICD-10-CM

## 2024-11-14 DIAGNOSIS — N289 Disorder of kidney and ureter, unspecified: Secondary | ICD-10-CM | POA: Diagnosis not present

## 2024-11-14 DIAGNOSIS — R3915 Urgency of urination: Secondary | ICD-10-CM

## 2024-11-14 DIAGNOSIS — N952 Postmenopausal atrophic vaginitis: Secondary | ICD-10-CM

## 2024-11-14 DIAGNOSIS — Z87440 Personal history of urinary (tract) infections: Secondary | ICD-10-CM | POA: Diagnosis not present

## 2024-11-14 LAB — BASIC METABOLIC PANEL WITH GFR
BUN/Creatinine Ratio: 24 (ref 12–28)
BUN: 52 mg/dL — ABNORMAL HIGH (ref 8–27)
CO2: 22 mmol/L (ref 20–29)
Calcium: 9.5 mg/dL (ref 8.7–10.3)
Chloride: 100 mmol/L (ref 96–106)
Creatinine, Ser: 2.17 mg/dL — ABNORMAL HIGH (ref 0.57–1.00)
Glucose: 102 mg/dL — ABNORMAL HIGH (ref 70–99)
Potassium: 4.2 mmol/L (ref 3.5–5.2)
Sodium: 139 mmol/L (ref 134–144)
eGFR: 22 mL/min/1.73 — ABNORMAL LOW

## 2024-11-14 MED ORDER — AMOXICILLIN 500 MG PO CAPS: 500.0000 mg | ORAL_CAPSULE | Freq: Every day | ORAL | 1 refills | Status: AC

## 2024-11-14 NOTE — Progress Notes (Signed)
Catheter Removal  Patient is present today for a catheter removal.  10ml of water was drained from the balloon. A 16FR foley cath was removed from the bladder, no complications were noted. Patient tolerated well.  Performed by: Fredirick Maudlin  Follow up/ Additional notes: No follow-ups on file.

## 2024-11-16 ENCOUNTER — Other Ambulatory Visit: Payer: Self-pay | Admitting: Urology

## 2024-11-16 DIAGNOSIS — N1330 Unspecified hydronephrosis: Secondary | ICD-10-CM

## 2024-11-21 ENCOUNTER — Ambulatory Visit: Payer: Self-pay

## 2024-11-23 ENCOUNTER — Ambulatory Visit: Admitting: Cardiology

## 2024-11-23 ENCOUNTER — Encounter: Payer: Self-pay | Admitting: Urology

## 2024-11-23 ENCOUNTER — Ambulatory Visit: Admitting: Urology

## 2024-11-23 ENCOUNTER — Ambulatory Visit: Payer: PRIVATE HEALTH INSURANCE | Admitting: Urology

## 2024-11-23 VITALS — BP 114/54 | HR 68 | Ht 65.0 in | Wt 125.0 lb

## 2024-11-23 DIAGNOSIS — Z87440 Personal history of urinary (tract) infections: Secondary | ICD-10-CM

## 2024-11-23 DIAGNOSIS — N1330 Unspecified hydronephrosis: Secondary | ICD-10-CM

## 2024-11-23 DIAGNOSIS — N289 Disorder of kidney and ureter, unspecified: Secondary | ICD-10-CM | POA: Diagnosis not present

## 2024-11-23 DIAGNOSIS — N3289 Other specified disorders of bladder: Secondary | ICD-10-CM | POA: Diagnosis not present

## 2024-11-23 DIAGNOSIS — N390 Urinary tract infection, site not specified: Secondary | ICD-10-CM

## 2024-11-23 LAB — URINALYSIS, ROUTINE W REFLEX MICROSCOPIC
Bilirubin, UA: NEGATIVE
Glucose, UA: NEGATIVE
Ketones, UA: NEGATIVE
Nitrite, UA: NEGATIVE
Specific Gravity, UA: 1.015 (ref 1.005–1.030)
Urobilinogen, Ur: 0.2 mg/dL (ref 0.2–1.0)
pH, UA: 6 (ref 5.0–7.5)

## 2024-11-23 LAB — MICROSCOPIC EXAMINATION: RBC, Urine: >30 /HPF — AB (ref 0–2)

## 2024-11-23 LAB — BLADDER SCAN AMB NON-IMAGING: PVR: 4 WU

## 2024-11-23 NOTE — Progress Notes (Signed)
 "    Assessment: -Bilateral hydronephrosis.  I think more than likely related to dysfunctional bladder.  She has been stented and is having significant lower urinary tract symptoms since her catheter has been removed  -Noncompliant bladder with bilateral hydronephrosis, relatively new onset.  She is having severe bladder spasms/urinary frequency/incontinence  -Thickened urothelium/ureteral walls bilaterally, perhaps due to recurrent infections  -Recurrent urinary tract infections    Plan: 1.  She is on solifenacin -I added 2-week supply of Gemtesa to take daily.  This and lieu of placing a catheter again  2.  Basic metabolic panel drawn to check creatinine  3.  I will have her come back in a couple of weeks to recheck symptoms.  She still has a referral for urodynamics that I think we should continue to get  4.  I did discuss with her the fact that with her bladder function and hydronephrosis, she may eventually need urinary diversion to alleviate her bladder dysfunction, improved renal drainage  History of Present Illness:  84 year old female presents to our office for the first time for evaluation and management of recurrent urinary tract infections as well as right hydronephrosis.  She has a history of kidney stones.  She underwent ureteroscopic stone management of a left distal ureteral stone in July 2018.  She had a left renal stone treated with shockwave lithotripsy in December 2016.  In July, 2022 she underwent CT chest/abdomen/pelvis which showed no renal calculi, but mild dilatation of the right renal pelvis and renal scarring bilaterally.  Workup in 2023 was performed because of worsening right hydronephrosis.  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  There was a small left renal stone.  There was also a previously seen 1.2 cm simple cyst, exophytic, on the right lower pole.  She had Lasix  renogram which showed findings consistent with  obstruction.  6.7.2023: Underwent cystoscopy, right retrograde, right ureteroscopy and ureteral stent by Dr. Devere.  This revealed right UPJ narrowing with mild to moderate dilatation of the right renal pelvis and calyces.  No upper tract lesions seen.  No intravesical abnormality seen.  Stent with tether placed.  Serum creatinine 0.6, GFR 8780  2.8.2024: Routine follow-up.  At that time, creatinine 1.0, GFR 53.  There was severe right hydro on ultrasound.  Urinalysis was clear.  9.18.2024: Hydronephrosis confirmed on repeat renal ultrasound.  Creatinine 0.7, GFR 81  4.17.2025: Routine follow-up.  Stable hydro noted on ultrasound.  Postvoid residual 23 mL.  Serum creatinine 1.2, GFR 42  5.7.2025: Repeat cystoscopy, retrograde, right ureteroscopy.  No stenosis of right UPJ noted.  No urothelial abnormalities noted.  11.17.2025-11.23.2025: Admission to the hospital in [facility] for acute renal insufficiency with a creatinine of 5.6.  Urinalysis was consistent with UTI.  She had ureteral thickening and right hydroureteronephrosis on CT scan.  CT also revealed thickened bladder wall.  Urologic consultation was provided.  Foley catheter was placed for short time, when removed she voided adequately.  Urine culture grew multiples PACs.  With the patient's creatinine improving with hydration and bladder drainage, creatinine improved.  Although stenting was recommended eventually, that was not performed during her hospitalization.  12.3.2025: First [place] visit for this woman.  She has been feeling better since she was discharged from the hospital.  She does have urinary frequency, urgency and dysuria.  She has been treated for multiple urinary tract infections.  Her urine culture did grow yeast.  12.17.2025: Underwent cystoscopy, bilateral retrograde pyelograms, placement of stents bilaterally.SABRA  There was obvious abnormalities with her ureteral orifices bilaterally.  They were golf-hole  configuration.  She had significant ureterectasis bilaterally.  Obvious distention of both collecting systems.  There was no evidence of obstruction, it was felt that the patient had significant reflux.  1.5.2025: Here today for follow-up.  She is not comfortable with the indwelling catheter at this time, she states it is uncomfortable to sit.  She is on solifenacin .  Not on any suppressive antibiotics.  No recent infections.  Her catheter was removed at this time.  Her basic metabolic panel was repeated.  Creatinine 2.17, down from prior level of 3.1 on 17 December.  1.14.2026: Since her catheter is come out she has had perineal pain, frequency, urgency, dysuria.  She states that she will sit and fall asleep on the toilet, where she spends a significant amount of time because her constant sense of urgency.  She has had no blood in her urine.  She has had no fevers.   Past Medical History:  Past Medical History:  Diagnosis Date   Arthritis    back   Depression    Diverticulosis of colon    Fibromyalgia    History of adenomatous polyp of colon    09-20-2004   History of kidney stones    History of pelvic mass    12-04-2015  s/p  lap. 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   History of urinary retention    hx post-op retention x 1 yrs ago none since   Hyperlipidemia    Hypertension    Left ureteral stone    Macular degeneration    Osteopenia    Type 2 diabetes mellitus (HCC)    Wears dentures    upper   Wears glasses     Past Surgical History:  Past Surgical History:  Procedure Laterality Date   ANKLE SURGERY Bilateral    yrs ago   CATARACT EXTRACTION W/ INTRAOCULAR LENS  IMPLANT, BILATERAL  1992   CYSTOSCOPY W/ RETROGRADES Bilateral 10/26/2024   Procedure: CYSTOSCOPY, WITH RETROGRADE PYELOGRAM AND BILATERAL STENT PLACEMENT;  Surgeon: Matilda Senior, MD;  Location: [facility];  Service: Urology;  Laterality: Bilateral;   CYSTOSCOPY W/ URETERAL  STENT PLACEMENT Right 04/16/2022   Procedure: CYSTOSCOPY WITH RIGHT RETROGRADE PYELOGRAM/ right ureteroscopy/  URETERAL STENT PLACEMENT;  Surgeon: Devere Lonni Righter, MD;  Location: [facility];  Service: Urology;  Laterality: Right;   CYSTOSCOPY WITH URETEROSCOPY AND STENT PLACEMENT Left 05/19/2017   Procedure: CYSTOSCOPY WITH URETEROSCOPY/ STONE EXTRACTION;  Surgeon: Watt Rush, MD;  Location: [facility];  Service: Urology;  Laterality: Left;   CYSTOSCOPY/URETEROSCOPY/HOLMIUM LASER/STENT PLACEMENT Right 03/16/2024   Procedure: CYSTOSCOPY/URETEROSCOPY/STENT PLACEMENT/RETROGRADE;  Surgeon: Devere Lonni Righter, MD;  Location: [facility];  Service: Urology;  Laterality: Right;  CYSTOSCOPY/RIGHT RETROGRADE PYELOGRAM/URETEROSCOPY/POSSIBLE HOLMIUM LASER/POSSIBLE STENT PLACEMENT   EXTRACORPOREAL SHOCK WAVE LITHOTRIPSY Left last one 11/08/2015   previously x2   LAPAROSCOPIC CHOLECYSTECTOMY     04-09-2005   dr toth   LAPAROSCOPY W/ BILATERAL SALPINGOOPHORECTOMY   12-04-2015   at [facility])   NEPHROLITHOTOMY  1978   ORIF RIGHT ANKLE FX  11/09/2008   TONSILLECTOMY  child   VAGINAL HYSTERECTOMY  1978    Allergies:  Allergies  Allergen Reactions   Nsaids Other (See Comments)    Renal insufficiency   Macrodantin [Nitrofurantoin Macrocrystal] Rash    Family History:  No family history on file.  Social History:  Social History  Tobacco Use   Smoking status: Former    Current packs/day: 0.00    Average packs/day: 1.5 packs/day for 20.0 years (30.0 ttl pk-yrs)    Types: Cigarettes    Start date: 11/10/1982    Quit date: 11/10/2002    Years since quitting: 22.0    Passive exposure: Never   Smokeless tobacco: Never  Vaping Use   Vaping status: Never Used  Substance Use Topics   Alcohol use: No   Drug use: No    Comment: no     Results:  Urinalysis today was clear  Residual urine volume 4 mL    "

## 2024-11-24 ENCOUNTER — Ambulatory Visit: Payer: Self-pay | Admitting: Urology

## 2024-11-24 LAB — BASIC METABOLIC PANEL WITH GFR
BUN/Creatinine Ratio: 36 — ABNORMAL HIGH (ref 12–28)
BUN: 60 mg/dL — ABNORMAL HIGH (ref 8–27)
CO2: 24 mmol/L (ref 20–29)
Calcium: 9.3 mg/dL (ref 8.7–10.3)
Chloride: 100 mmol/L (ref 96–106)
Creatinine, Ser: 1.68 mg/dL — ABNORMAL HIGH (ref 0.57–1.00)
Glucose: 109 mg/dL — ABNORMAL HIGH (ref 70–99)
Potassium: 5.7 mmol/L — ABNORMAL HIGH (ref 3.5–5.2)
Sodium: 139 mmol/L (ref 134–144)
eGFR: 30 mL/min/1.73 — ABNORMAL LOW

## 2024-11-29 NOTE — Progress Notes (Signed)
 "    Assessment: -Bilateral hydronephrosis.  I think more than likely related to dysfunctional bladder.  She has been stented and is having significant lower urinary tract symptoms since her catheter has been removed.  Additionally, creatinine is improving  -Noncompliant bladder with bilateral hydronephrosis, relatively new onset.  She is having severe bladder spasms/urinary frequency/incontinence.  This has improved some with dual therapy for OAB  -Thickened urothelium/ureteral walls bilaterally, perhaps due to recurrent infections  -Recurrent urinary tract infections.  Very small amount of urine obtained today, question UTI   Plan: 1.  She is on solifenacin -as well as Gemtesa.  I gave her more samples of Gemtesa  2.  She should continue her appointment for urodynamics  3.  I ordered urine culture, but voided urine amount was not enough for this  4.  I will have her come back in about 4 weeks to recheck  5.  She will continue the methenamine   History of Present Illness:  84 year old female presents to our office for the first time for evaluation and management of recurrent urinary tract infections as well as right hydronephrosis.  She has a history of kidney stones.  She underwent ureteroscopic stone management of a left distal ureteral stone in July 2018.  She had a left renal stone treated with shockwave lithotripsy in December 2016.  In July, 2022 she underwent CT chest/abdomen/pelvis which showed no renal calculi, but mild dilatation of the right renal pelvis and renal scarring bilaterally.  Workup in 2023 was performed because of worsening right hydronephrosis.  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  There was a small left renal stone.  There was also a previously seen 1.2 cm simple cyst, exophytic, on the right lower pole.  She had Lasix  renogram which showed findings consistent with obstruction.  6.7.2023: Underwent cystoscopy, right retrograde, right  ureteroscopy and ureteral stent by Dr. Devere.  This revealed right UPJ narrowing with mild to moderate dilatation of the right renal pelvis and calyces.  No upper tract lesions seen.  No intravesical abnormality seen.  Stent with tether placed.  Serum creatinine 0.6, GFR 8780  2.8.2024: Routine follow-up.  At that time, creatinine 1.0, GFR 53.  There was severe right hydro on ultrasound.  Urinalysis was clear.  9.18.2024: Hydronephrosis confirmed on repeat renal ultrasound.  Creatinine 0.7, GFR 81  4.17.2025: Routine follow-up.  Stable hydro noted on ultrasound.  Postvoid residual 23 mL.  Serum creatinine 1.2, GFR 42  5.7.2025: Repeat cystoscopy, retrograde, right ureteroscopy.  No stenosis of right UPJ noted.  No urothelial abnormalities noted.  11.17.2025-11.23.2025: Admission to the hospital in [facility] for acute renal insufficiency with a creatinine of 5.6.  Urinalysis was consistent with UTI.  She had ureteral thickening and right hydroureteronephrosis on CT scan.  CT also revealed thickened bladder wall.  Urologic consultation was provided.  Foley catheter was placed for short time, when removed she voided adequately.  Urine culture grew multiples PACs.  With the patient's creatinine improving with hydration and bladder drainage, creatinine improved.  Although stenting was recommended eventually, that was not performed during her hospitalization.  12.3.2025: First [place] visit for this woman.  She has been feeling better since she was discharged from the hospital.  She does have urinary frequency, urgency and dysuria.  She has been treated for multiple urinary tract infections.  Her urine culture did grow yeast.  12.17.2025: Underwent cystoscopy, bilateral retrograde pyelograms, placement of stents bilaterally..  There was obvious abnormalities with her  ureteral orifices bilaterally.  They were golf-hole configuration.  She had significant ureterectasis bilaterally.  Obvious distention of  both collecting systems.  There was no evidence of obstruction, it was felt that the patient had significant reflux.  1.5.2025: Here today for follow-up.  She is not comfortable with the indwelling catheter at this time, she states it is uncomfortable to sit.  She is on solifenacin .  Not on any suppressive antibiotics.  No recent infections.  Her catheter was removed at this time.  Her basic metabolic panel was repeated.  Creatinine 2.17, down from prior level of 3.1 on 17 December.  1.14.2026: Since her catheter is come out she has had perineal pain, frequency, urgency, dysuria.  She states that she will sit and fall asleep on the toilet, where she spends a significant amount of time because her constant sense of urgency.  She has had no blood in her urine.  She has had no fevers.  1.21.2026: Here today for recheck.  She thinks that her urinary symptoms have perhaps improved a little bit on the dual therapy with solifenacin  and Gemtesa.  Is out of Gemtesa samples.  She does have vaginal pain when she is sitting.  She has had no fevers.  Still having significant urgency and frequency but better. Past Medical History:  Past Medical History:  Diagnosis Date   Arthritis    back   Depression    Diverticulosis of colon    Fibromyalgia    History of adenomatous polyp of colon    09-20-2004   History of kidney stones    History of pelvic mass    12-04-2015  s/p  lap. 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   History of urinary retention    hx post-op retention x 1 yrs ago none since   Hyperlipidemia    Hypertension    Left ureteral stone    Macular degeneration    Osteopenia    Type 2 diabetes mellitus (HCC)    Wears dentures    upper   Wears glasses     Past Surgical History:  Past Surgical History:  Procedure Laterality Date   ANKLE SURGERY Bilateral    yrs ago   CATARACT EXTRACTION W/ INTRAOCULAR LENS  IMPLANT, BILATERAL  1992   CYSTOSCOPY W/  RETROGRADES Bilateral 10/26/2024   Procedure: CYSTOSCOPY, WITH RETROGRADE PYELOGRAM AND BILATERAL STENT PLACEMENT;  Surgeon: Matilda Senior, MD;  Location: [facility];  Service: Urology;  Laterality: Bilateral;   CYSTOSCOPY W/ URETERAL STENT PLACEMENT Right 04/16/2022   Procedure: CYSTOSCOPY WITH RIGHT RETROGRADE PYELOGRAM/ right ureteroscopy/  URETERAL STENT PLACEMENT;  Surgeon: Devere Lonni Righter, MD;  Location: [facility];  Service: Urology;  Laterality: Right;   CYSTOSCOPY WITH URETEROSCOPY AND STENT PLACEMENT Left 05/19/2017   Procedure: CYSTOSCOPY WITH URETEROSCOPY/ STONE EXTRACTION;  Surgeon: Watt Rush, MD;  Location: [facility];  Service: Urology;  Laterality: Left;   CYSTOSCOPY/URETEROSCOPY/HOLMIUM LASER/STENT PLACEMENT Right 03/16/2024   Procedure: CYSTOSCOPY/URETEROSCOPY/STENT PLACEMENT/RETROGRADE;  Surgeon: Devere Lonni Righter, MD;  Location: [facility];  Service: Urology;  Laterality: Right;  CYSTOSCOPY/RIGHT RETROGRADE PYELOGRAM/URETEROSCOPY/POSSIBLE HOLMIUM LASER/POSSIBLE STENT PLACEMENT   EXTRACORPOREAL SHOCK WAVE LITHOTRIPSY Left last one 11/08/2015   previously x2   LAPAROSCOPIC CHOLECYSTECTOMY     04-09-2005   dr toth   LAPAROSCOPY W/ BILATERAL SALPINGOOPHORECTOMY   12-04-2015   at [facility])   NEPHROLITHOTOMY  1978   ORIF RIGHT ANKLE FX  11/09/2008   TONSILLECTOMY  child  VAGINAL HYSTERECTOMY  1978    Allergies:  Allergies  Allergen Reactions   Nsaids Other (See Comments)    Renal insufficiency   Macrodantin [Nitrofurantoin Macrocrystal] Rash    Family History:  No family history on file.  Social History:  Social History   Tobacco Use   Smoking status: Former    Current packs/day: 0.00    Average packs/day: 1.5 packs/day for 20.0 years (30.0 ttl pk-yrs)    Types: Cigarettes    Start date: 11/10/1982    Quit date: 11/10/2002    Years since quitting: 22.0    Passive exposure: Never   Smokeless tobacco: Never  Vaping  Use   Vaping status: Never Used  Substance Use Topics   Alcohol use: No   Drug use: No    Comment: no     Results:  Urinalysis today, very small volume, had bacteria and white cells  Past notes reviewed    "

## 2024-11-30 ENCOUNTER — Other Ambulatory Visit (HOSPITAL_BASED_OUTPATIENT_CLINIC_OR_DEPARTMENT_OTHER): Payer: Self-pay

## 2024-11-30 ENCOUNTER — Ambulatory Visit: Admitting: Urology

## 2024-11-30 VITALS — BP 111/56 | HR 67 | Ht 65.0 in | Wt 125.0 lb

## 2024-11-30 DIAGNOSIS — N1330 Unspecified hydronephrosis: Secondary | ICD-10-CM

## 2024-11-30 DIAGNOSIS — N3289 Other specified disorders of bladder: Secondary | ICD-10-CM | POA: Diagnosis not present

## 2024-11-30 DIAGNOSIS — N390 Urinary tract infection, site not specified: Secondary | ICD-10-CM

## 2024-11-30 DIAGNOSIS — N289 Disorder of kidney and ureter, unspecified: Secondary | ICD-10-CM

## 2024-11-30 DIAGNOSIS — R350 Frequency of micturition: Secondary | ICD-10-CM

## 2024-11-30 DIAGNOSIS — R3915 Urgency of urination: Secondary | ICD-10-CM | POA: Diagnosis not present

## 2024-11-30 DIAGNOSIS — N952 Postmenopausal atrophic vaginitis: Secondary | ICD-10-CM

## 2024-11-30 DIAGNOSIS — Z87440 Personal history of urinary (tract) infections: Secondary | ICD-10-CM

## 2024-11-30 LAB — URINALYSIS, ROUTINE W REFLEX MICROSCOPIC
Bilirubin, UA: NEGATIVE
Glucose, UA: NEGATIVE
Ketones, UA: NEGATIVE
Nitrite, UA: NEGATIVE
Specific Gravity, UA: 1.02 (ref 1.005–1.030)
Urobilinogen, Ur: 0.2 mg/dL (ref 0.2–1.0)
pH, UA: 6 (ref 5.0–7.5)

## 2024-11-30 LAB — MICROSCOPIC EXAMINATION: RBC, Urine: >30 /HPF — AB (ref 0–2)

## 2024-11-30 LAB — BLADDER SCAN AMB NON-IMAGING: PVR: 0 WU

## 2024-11-30 MED ORDER — METHENAMINE HIPPURATE 1 G PO TABS
1.0000 g | ORAL_TABLET | Freq: Two times a day (BID) | ORAL | 11 refills | Status: AC
  Filled 2024-11-30 (×4): qty 60, 30d supply, fill #0

## 2024-12-06 ENCOUNTER — Ambulatory Visit: Admitting: Cardiology

## 2024-12-22 ENCOUNTER — Other Ambulatory Visit

## 2024-12-28 ENCOUNTER — Ambulatory Visit: Admitting: Urology

## 2024-12-29 ENCOUNTER — Ambulatory Visit: Admitting: Cardiology
